# Patient Record
Sex: Female | Born: 2000
Health system: Southern US, Community
[De-identification: ages and names within clinical notes are randomized; demographics above are authoritative.]

## PROBLEM LIST (undated history)

## (undated) DIAGNOSIS — R112 Nausea with vomiting, unspecified: Secondary | ICD-10-CM

## (undated) DIAGNOSIS — F329 Major depressive disorder, single episode, unspecified: Secondary | ICD-10-CM

## (undated) DIAGNOSIS — F419 Anxiety disorder, unspecified: Secondary | ICD-10-CM

## (undated) DIAGNOSIS — D573 Sickle-cell trait: Secondary | ICD-10-CM

## (undated) DIAGNOSIS — F1290 Cannabis use, unspecified, uncomplicated: Secondary | ICD-10-CM

## (undated) DIAGNOSIS — F32A Depression, unspecified: Secondary | ICD-10-CM

---
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
## 2022-04-13 IMAGING — US US OB COMP LESS 14 WK
1 series · 15 of 28 positions shown · non-contrast
Comparison: None.

CLINICAL DATA: Pregnant, unspecified abdominal pain, unknown LMP.

EXAM:
OBSTETRIC <14 WK ULTRASOUND
TECHNIQUE: Transabdominal ultrasound was performed for evaluation of the
gestation as well as the maternal uterus and adnexal regions.

[Series 1: us ob comp less 14 wks mc & wl · 15 of 38 slices shown]
[im 1/38]
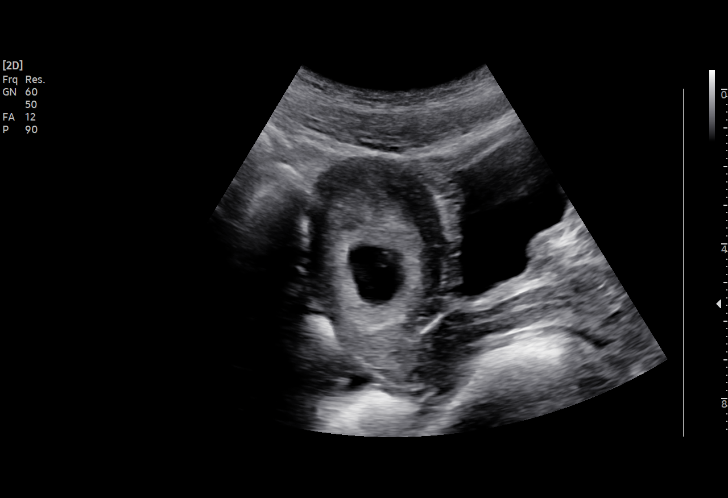
[im 3/38]
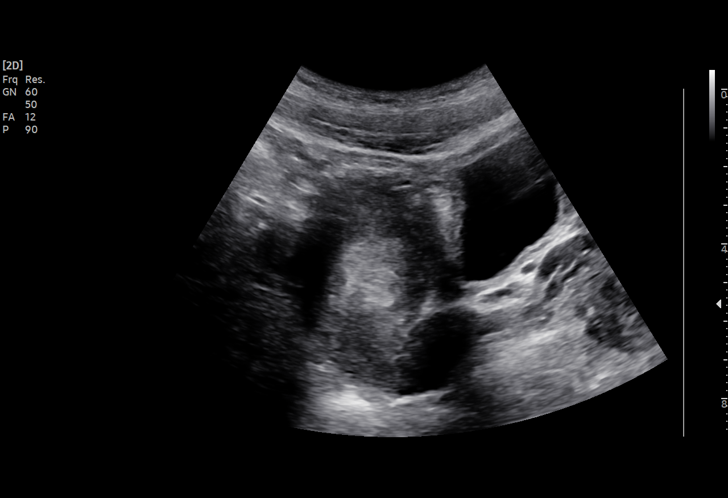
[im 6/38]
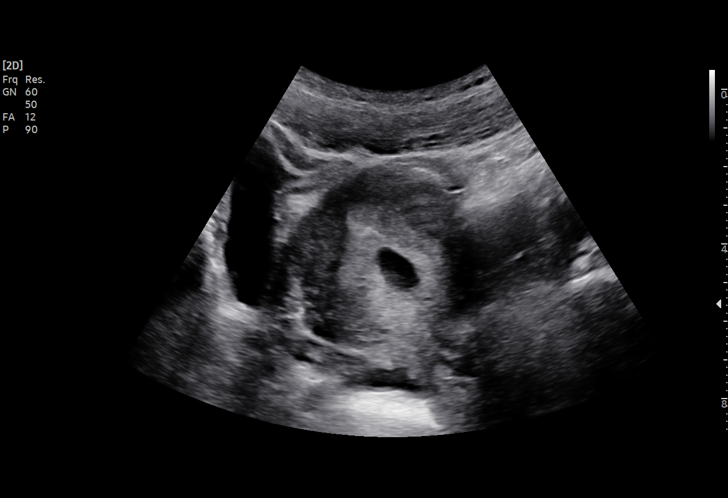
[im 9/38]
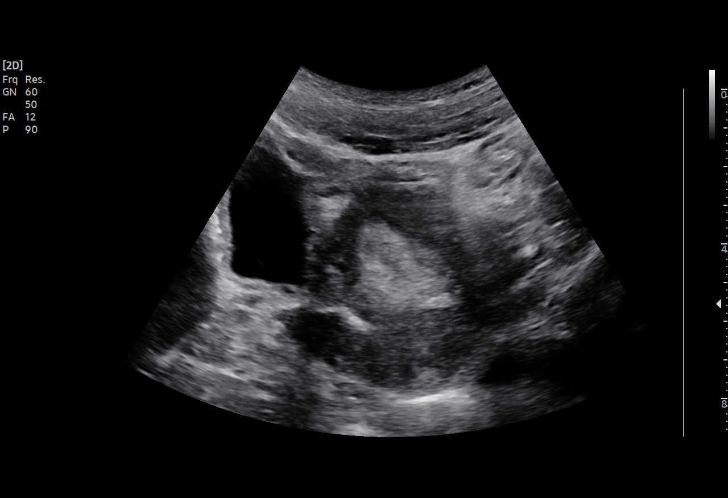
[im 11/38]
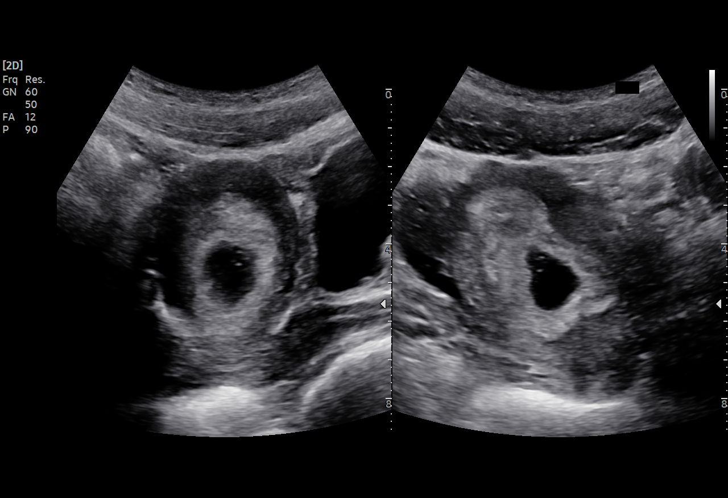
[im 14/38]
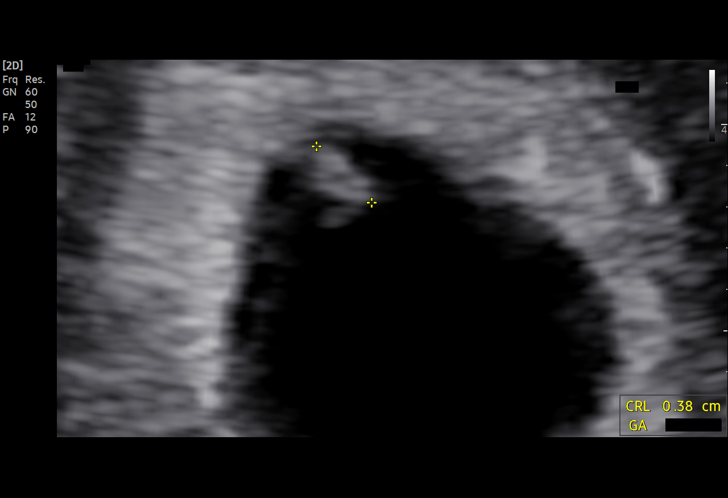
[im 17/38]
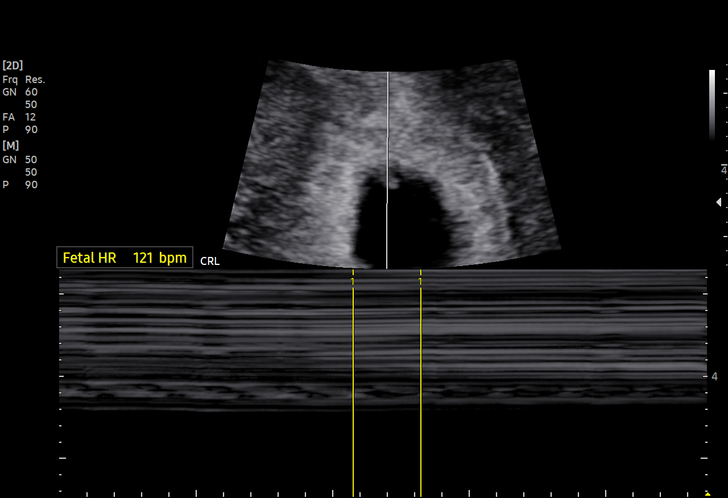
[im 20/38]
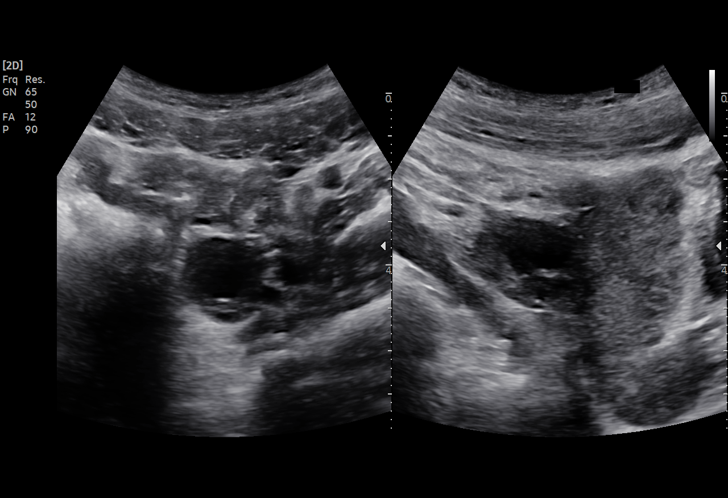
[im 21/38]
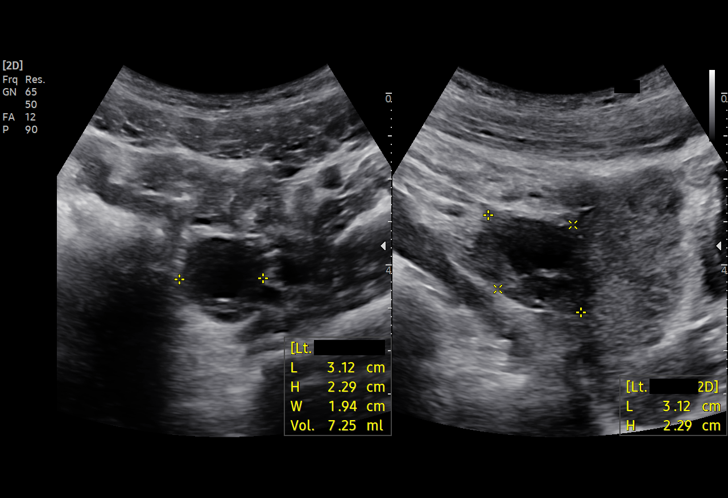
[im 24/38]
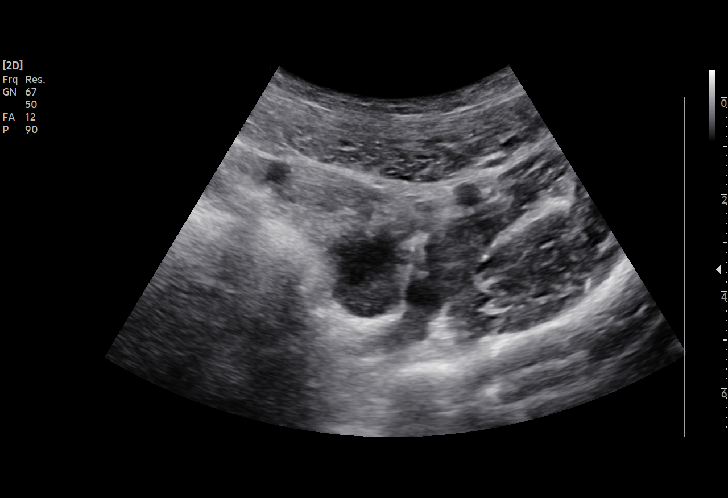
[im 27/38]
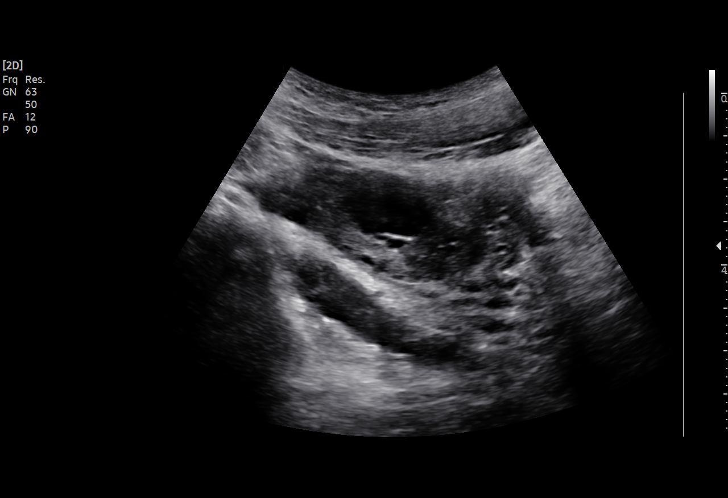
[im 29/38]
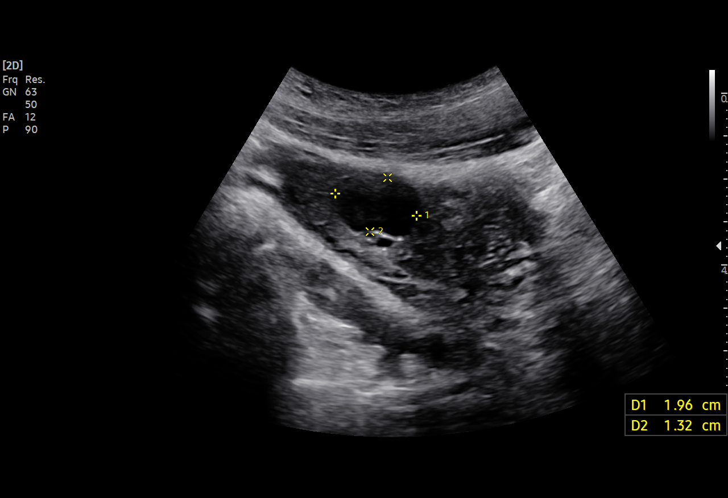
[im 32/38]
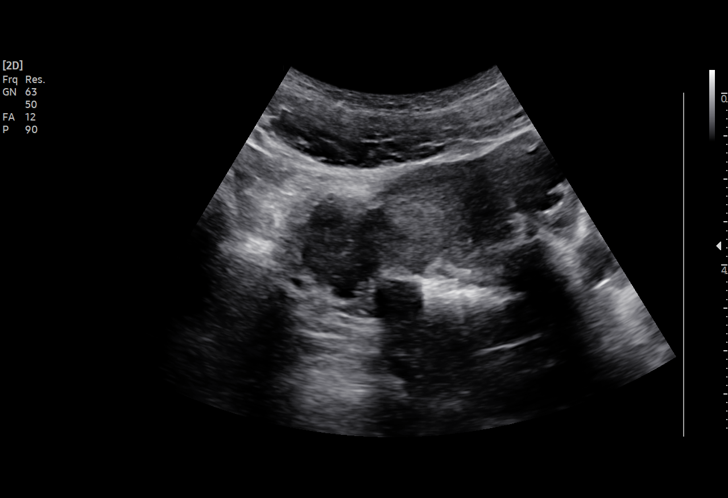
[im 35/38]
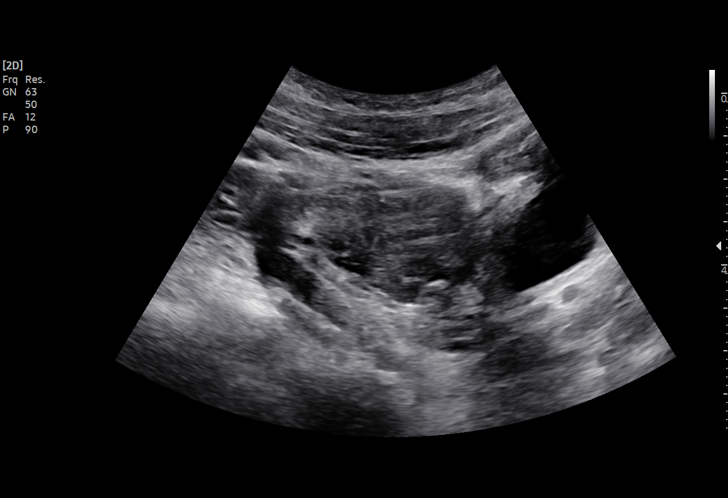
[im 38/38]
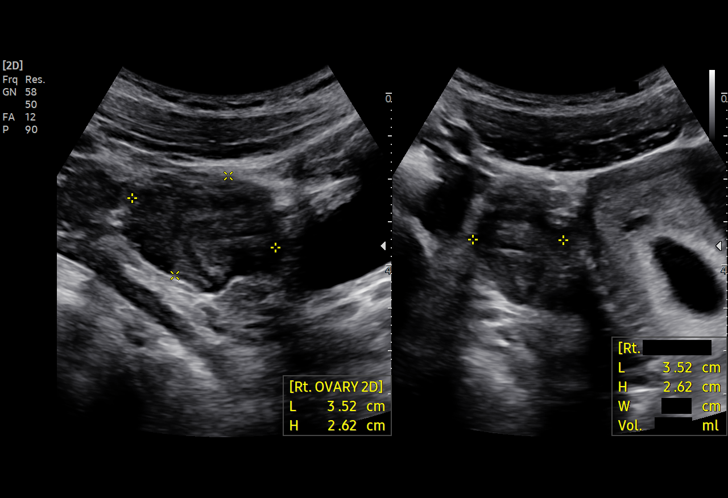

[15 of 28 positions shown; findings below may reference images not displayed]

FINDINGS: Intrauterine gestational sac: Present, single

Yolk sac:  Present, single, normal appearing

Embryo:  Present, single

Cardiac Activity: Present, regular

Heart Rate: 121 bpm

MSD: Appropriate given fetal size

CRL:   4 mm   6 w 0 d                  US EDC: 09/17/2021

Subchorionic hemorrhage: A small subchorionic hemorrhage is present
comprising less than 25% of the chorionic surface.

Maternal uterus/adnexae: The uterus is anteverted. No intrauterine
masses are seen. The cervix is closed and is unremarkable. No free
fluid within the pelvis. The maternal ovaries are unremarkable.
IMPRESSION: Single living intrauterine gestation with an estimated gestational
age of 6 weeks, 0 days.

Small subchorionic hemorrhage.

## 2022-04-14 IMAGING — US US ABDOMEN LIMITED
1 series · 15 of 25 positions shown · non-contrast
Comparison: None.

CLINICAL DATA: Pregnant, right upper quadrant abdominal pain

EXAM:
ULTRASOUND ABDOMEN LIMITED RIGHT UPPER QUADRANT

[Series 1: us abdomen limited · 51 acquisitions, 15 frames shown]
[im 1/51]
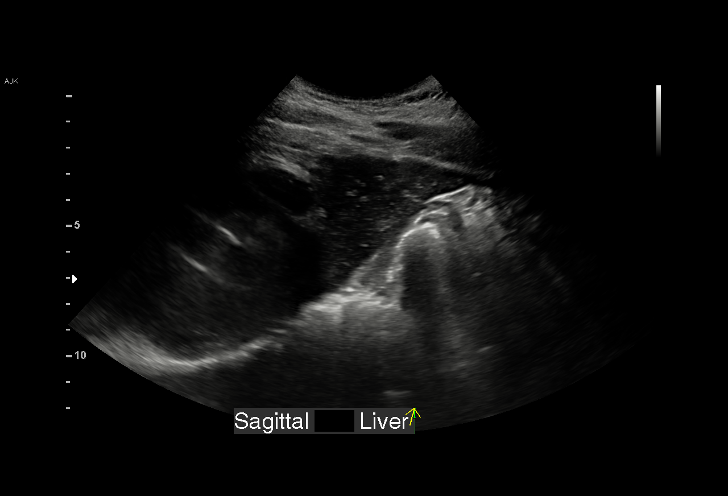
[im 5/51]
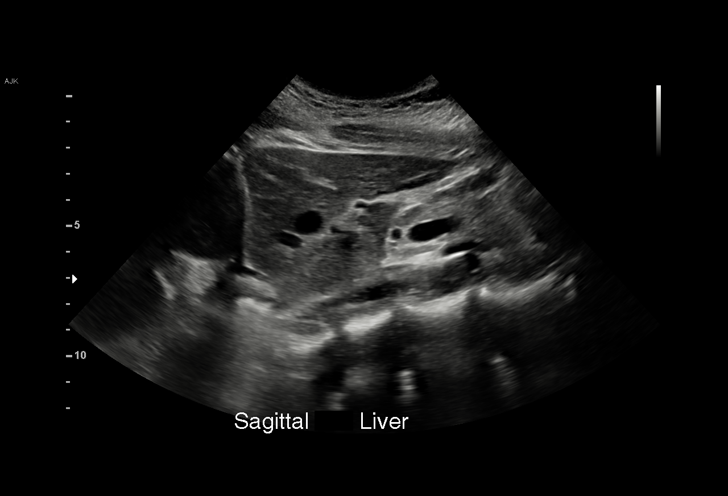
[im 9/51]
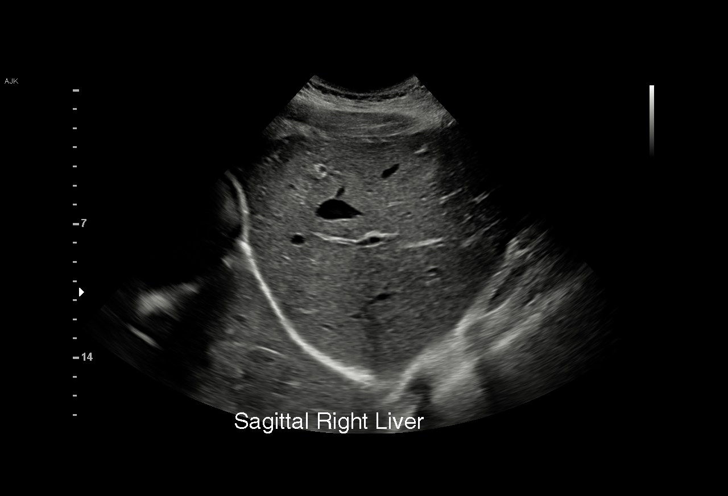
[im 11/51]
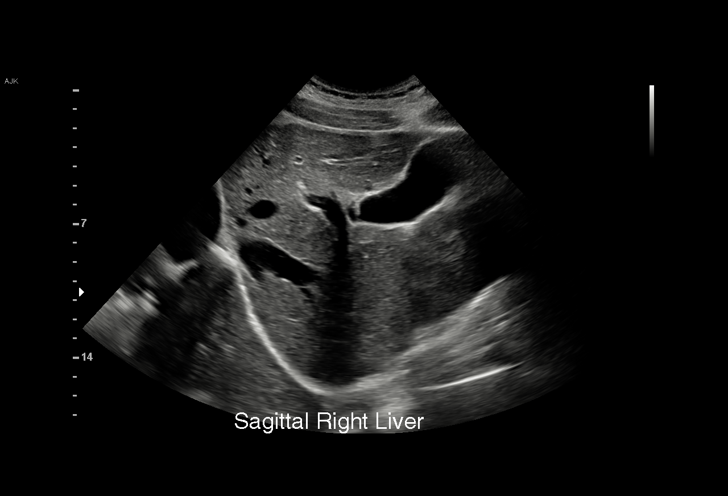
[im 15/51]
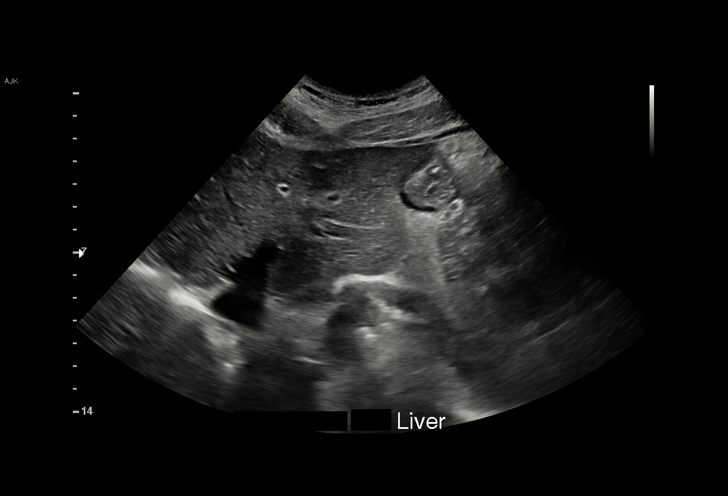
[im 19/51]
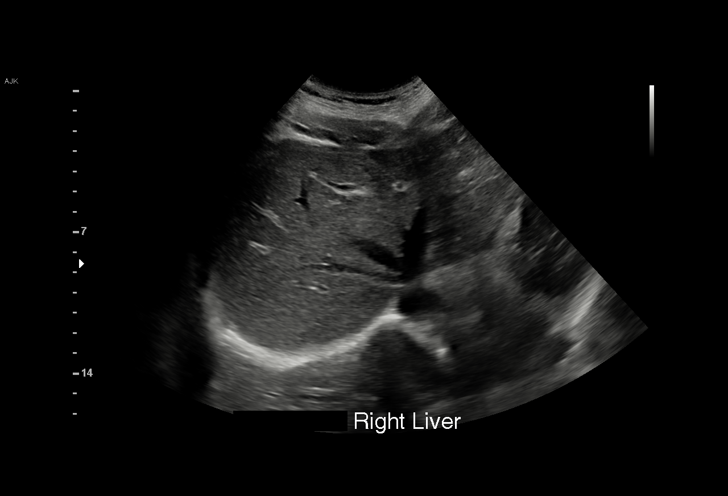
[im 21/51]
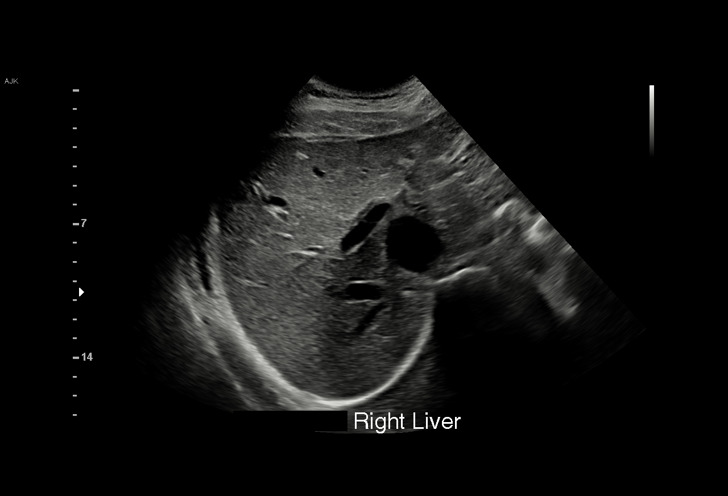
[im 26/51]
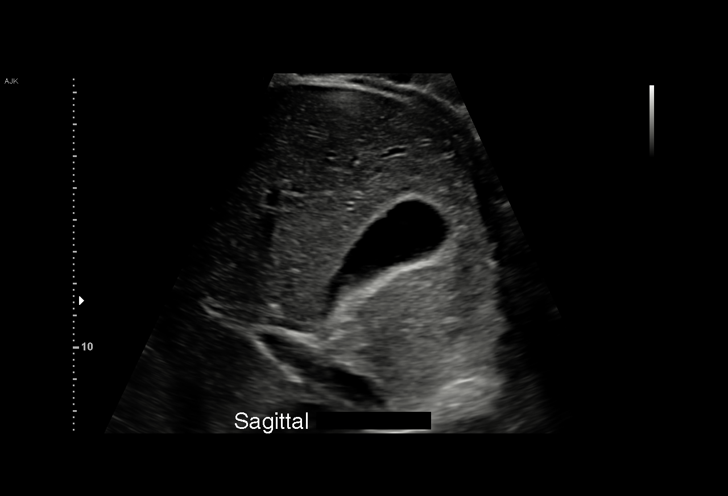
[im 30/51]
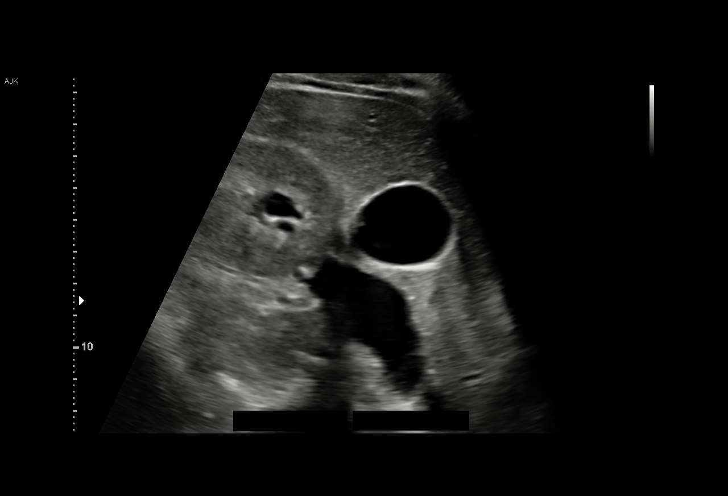
[im 32/51]
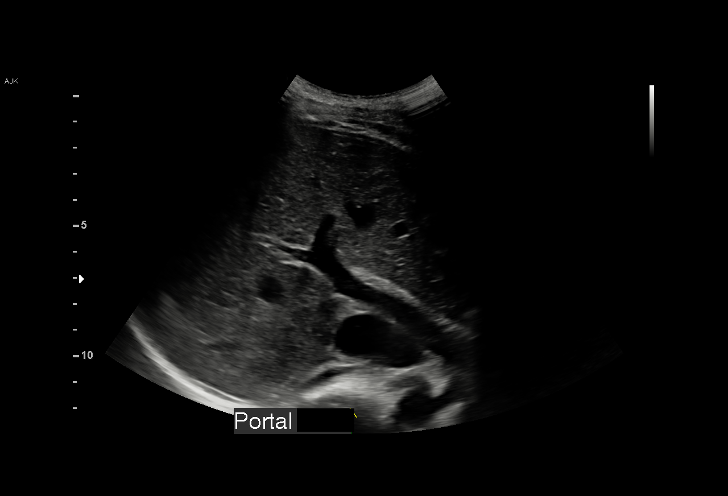
[im 36/51]
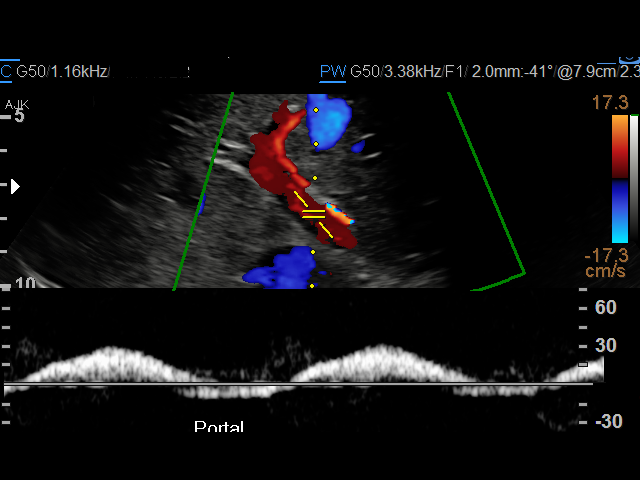
[im 40/51]
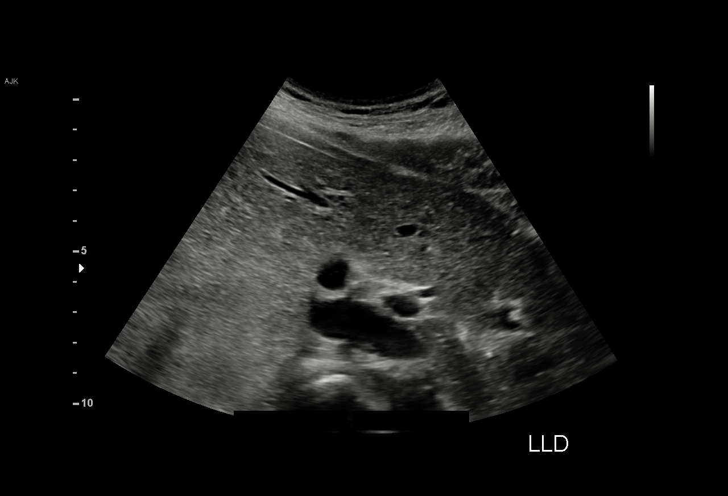
[im 42/51]
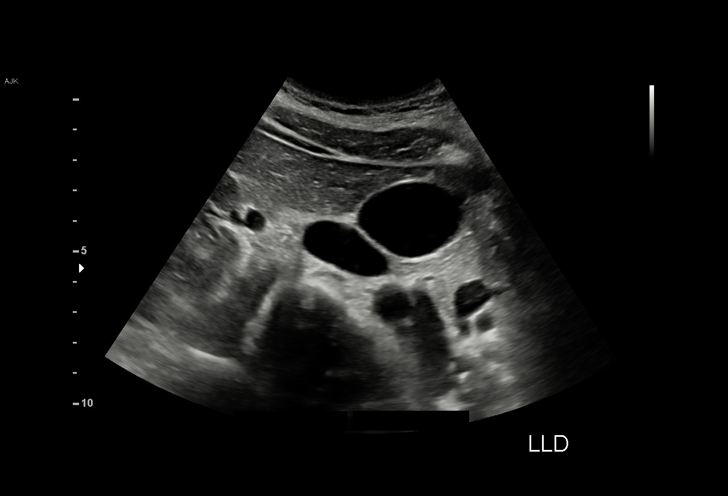
[im 46/51]
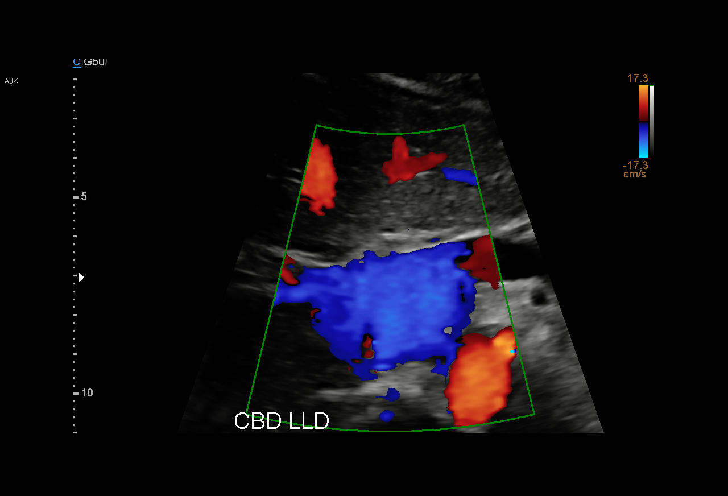
[im 51/51]
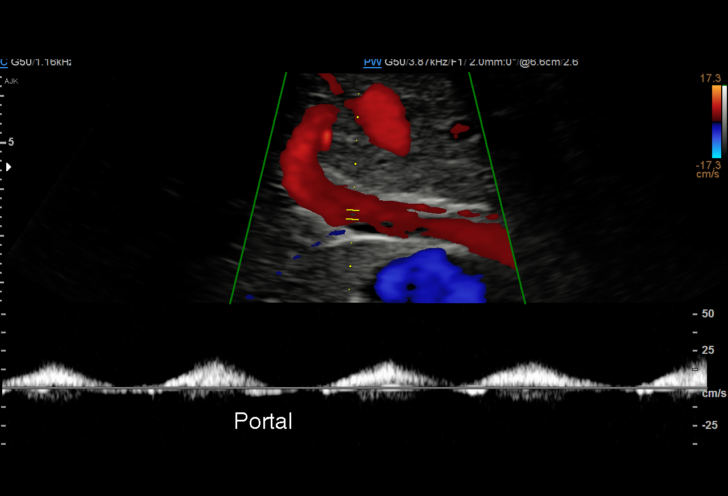

[15 of 25 positions shown; findings below may reference images not displayed]

FINDINGS: Gallbladder:

No gallstones or wall thickening visualized. No sonographic Murphy
sign noted by sonographer.

Common bile duct:

Diameter: 2 mm in proximal diameter

Liver:

Hepatic parenchymal echogenicity and echotexture is normal. No focal
intrahepatic masses are seen and there is no intrahepatic biliary
ductal dilation. The intrahepatic inferior vena cava and hepatic
veins at the hepatic caval junction are all widely patent. The
portal vein is patent but demonstrates biphasic flow. This can be
seen in the setting of right heart failure or tricuspid
regurgitation, elevated sinusoidal pressure, as can be seen with
underlying hepatocellular dysfunction, or post sinusoidal
obstruction such as hepatic Ferienhaus disease. Nock Chiari,
however, is unlikely given patency of the a central hepatic venous
vasculature.

Other: None.
IMPRESSION: Biphasic portal venous waveform. Differential considerations are as
listed above and correlation with liver enzymes may be helpful. Most
commonly, this is seen in the setting of tricuspid regurgitation in
a patient of this age and could be confirmed with echocardiography
if indicated.

## 2022-06-14 ENCOUNTER — Encounter (HOSPITAL_COMMUNITY): Payer: Self-pay

## 2022-06-14 ENCOUNTER — Ambulatory Visit (HOSPITAL_COMMUNITY)
Admission: EM | Admit: 2022-06-14 | Discharge: 2022-06-14 | Disposition: A | Discharge status: Home / Self Care | Payer: Medicaid Other | Attending: Physician Assistant | Admitting: Physician Assistant

## 2022-06-14 DIAGNOSIS — Z20822 Contact with and (suspected) exposure to covid-19: Secondary | ICD-10-CM | POA: Diagnosis not present

## 2022-06-14 DIAGNOSIS — Z87891 Personal history of nicotine dependence: Secondary | ICD-10-CM | POA: Insufficient documentation

## 2022-06-14 DIAGNOSIS — J029 Acute pharyngitis, unspecified: Secondary | ICD-10-CM | POA: Diagnosis present

## 2022-06-14 DIAGNOSIS — J069 Acute upper respiratory infection, unspecified: Secondary | ICD-10-CM | POA: Insufficient documentation

## 2022-06-14 LAB — RESP PANEL BY RT-PCR (FLU A&B, COVID) ARPGX2
Influenza A by PCR: NEGATIVE
Influenza B by PCR: NEGATIVE
SARS Coronavirus 2 by RT PCR: NEGATIVE

## 2022-06-14 MED ORDER — FLUTICASONE PROPIONATE 50 MCG/ACT NA SUSP: 1.0000 | Freq: Every day | NASAL | 0 refills | Status: DC

## 2022-06-14 MED ORDER — PROMETHAZINE-DM 6.25-15 MG/5ML PO SYRP: 5.0000 mL | ORAL_SOLUTION | Freq: Two times a day (BID) | ORAL | 0 refills | Status: DC | PRN

## 2022-06-14 NOTE — ED Triage Notes (Signed)
Pt present to the office today for cough,congestion and sore throat x 2-3 days.

## 2022-06-14 NOTE — Discharge Instructions (Signed)
Monitor your MyChart for results.  We will contact you if you are positive for COVID or flu.  Use Flonase for nasal congestion.  Take Promethazine DM for cough.  This will make you sleepy so do not drive or drink alcohol while taking it.  You can use over-the-counter medications for symptom management.  Rest and drink plenty of fluid.  If your symptoms do not improving by next week return for reevaluation.  If anything worsens to be seen immediately.

## 2022-06-14 NOTE — ED Provider Notes (Signed)
Cathy Graham    CSN: [id number] Arrival date & time: 06/14/22  1205      History   Chief Complaint Chief Complaint  Patient presents with   Sore Throat    HPI Cathy Graham is a 21 y.o. female.   Patient presents today with a 2 to 3-day history of URI symptoms.  Reports nasal congestion, mild cough, chills, fatigue, malaise.  Denies any chest pain, shortness of breath, nausea, vomiting, diarrhea.  Does report known sick contacts but does not know what they were ultimately diagnosed with.  She has not had COVID in the past.  Has had COVID vaccines but no booster.  Denies history of allergies, asthma, COPD.  Does smoke.  No concern for pregnancy.  Has been taking over-the-counter medications with minimal improvement of symptoms.  Denies any recent antibiotic or steroid use.    Past Medical History:  Diagnosis Date   Anxiety    Cannabinoid hyperemesis syndrome    Depression    Sickle cell trait [facility])     Patient Active Problem List   Diagnosis Date Noted   Moderate dehydration 04/13/2020   Emesis, persistent 04/12/2020   Emesis 04/12/2020   MDD (major depressive disorder), recurrent severe, without psychosis ([place]) 08/09/2019    Past Surgical History:  Procedure Laterality Date   GUM SURGERY  2020    OB History     Gravida  1   Para      Term      Preterm      AB      Living         SAB      IAB      Ectopic      Multiple      Live Births               Home Medications    Prior to Admission medications   Medication Sig Start Date End Date Taking? Authorizing Provider  fluticasone (FLONASE) 50 MCG/ACT nasal spray Place 1 spray into both nostrils daily. 06/14/22  Yes Taiwan Millon K, PA-C  promethazine-dextromethorphan (PROMETHAZINE-DM) 6.25-15 MG/5ML syrup Take 5 mLs by mouth 2 (two) times daily as needed for cough. 06/14/22  Yes Anthonee Gelin K, PA-C  capsaicin (ZOSTRIX) 0.025 % cream Apply topically 3 (three) times daily as  needed (For nausea/pain associated with cannabinoid hyperemesis). 04/13/20   Esperanza Richters, MD  Doxylamine-Pyridoxine 10-10 MG TBEC Two tablets at bedtime on day 1 and 2; if symptoms persist, take 1 tablet in morning and 2 tablets at bedtime on day 3; if symptoms persist, may increase to 1 tablet in morning, 1 tablet mid-afternoon, and 2 tablets at bedtime on day 4 01/22/21   Petrucelli, Samantha R, PA-C  famotidine (PEPCID) 40 MG tablet Take 1 tablet (40 mg total) by mouth daily. 04/13/20   Esperanza Richters, MD  hydrOXYzine (ATARAX/VISTARIL) 10 MG tablet Take 10 mg by mouth every 6 (six) hours as needed for anxiety. 08/26/19   [provider]  promethazine (PHENERGAN) 25 MG tablet Take 1 tablet (25 mg total) by mouth every 6 (six) hours as needed for nausea or vomiting. 01/23/21   Jorje Guild, NP  scopolamine (TRANSDERM-SCOP) 1 MG/3DAYS Place 1 patch (1.5 mg total) onto the skin every 3 (three) days. 01/26/21   Gabriel Carina, CNM    Family History Family History  Family history unknown: Yes    Social History Social History   Tobacco Use   Smoking status: Former  Types: Cigarettes    Quit date: 12/23/2020    Years since quitting: 1.4   Smokeless tobacco: Never  Vaping Use   Vaping Use: Former  Substance Use Topics   Alcohol use: Not Currently    Comment: Patient reports using alcohol some of the time when it is available.   Drug use: Not Currently    Types: Marijuana    Comment: last reported use 01/14/2021     Allergies   Patient has no known allergies.   Review of Systems Review of Systems  Constitutional:  Positive for activity change, chills and fatigue. Negative for appetite change and fever.  HENT:  Positive for congestion and sore throat. Negative for sinus pressure and sneezing.   Respiratory:  Positive for cough. Negative for shortness of breath.   Cardiovascular:  Negative for chest pain.  Gastrointestinal:  Negative for abdominal pain, diarrhea,  nausea and vomiting.  Neurological:  Negative for dizziness, light-headedness and headaches.     Physical Exam Triage Vital Signs ED Triage Vitals  Enc Vitals Group     BP 06/14/22 1417 113/65     Pulse Rate 06/14/22 1417 100     Resp 06/14/22 1422 18     Temp 06/14/22 1417 98.3 F (36.8 C)     Temp Source 06/14/22 1417 Oral     SpO2 06/14/22 1422 98 %     Weight --      Height --      Head Circumference --      Peak Flow --      Pain Score --      Pain Loc --      Pain Edu? --      Excl. in [place]? --    No data found.  Updated Vital Signs BP 113/65   Pulse 100   Temp 98.3 F (36.8 C) (Oral)   Resp 18   SpO2 98%   Visual Acuity Right Eye Distance:   Left Eye Distance:   Bilateral Distance:    Right Eye Near:   Left Eye Near:    Bilateral Near:     Physical Exam Vitals reviewed.  Constitutional:      General: She is awake. She is not in acute distress.    Appearance: Normal appearance. She is well-developed. She is not ill-appearing.     Comments: Very pleasant female appears stated age in no acute distress sitting comfortably in exam room  HENT:     Head: Normocephalic and atraumatic.     Right Ear: Tympanic membrane, ear canal and external ear normal. Tympanic membrane is not erythematous or bulging.     Left Ear: Tympanic membrane, ear canal and external ear normal. Tympanic membrane is not erythematous or bulging.     Nose:     Right Sinus: No maxillary sinus tenderness or frontal sinus tenderness.     Left Sinus: No maxillary sinus tenderness or frontal sinus tenderness.     Mouth/Throat:     Pharynx: Uvula midline. Posterior oropharyngeal erythema present. No oropharyngeal exudate.  Cardiovascular:     Rate and Rhythm: Normal rate and regular rhythm.     Heart sounds: Normal heart sounds, S1 normal and S2 normal. No murmur heard. Pulmonary:     Effort: Pulmonary effort is normal.     Breath sounds: Normal breath sounds. No wheezing, rhonchi or rales.      Comments: Clear to auscultation bilaterally Psychiatric:        Behavior: Behavior is cooperative.  UC Treatments / Results  Labs (all labs ordered are listed, but only abnormal results are displayed) Labs Reviewed  RESP PANEL BY RT-PCR (FLU A&B, COVID) [id number]    EKG   Radiology No results found.  Procedures Procedures (including critical care time)  Medications Ordered in UC Medications - No data to display  Initial Impression / Assessment and Plan / UC Course  I have reviewed the triage vital signs and the nursing notes.  Pertinent labs & imaging results that were available during my care of the patient were reviewed by me and considered in my medical decision making (see chart for details).     Patient is well-appearing, afebrile, nontoxic, nontachycardic.  Suspect viral etiology given clinical presentation.  COVID/flu testing was obtained today-results pending.  She is outside the window of effectiveness for Tamiflu and is young and otherwise healthy send not interested in COVID antivirals.  We will treat symptomatically with Flonase and Promethazine DM.  Discussed that this medication can be sedating and she is not to drive or drink alcohol with taking it.  Can use over-the-counter medications for additional symptom relief.  She is to rest and drink plenty of fluid.  Recommend she monitor her MyChart for results.  We will contact her if she is positive.  She was provided a work excuse note with current CDC return to work guidelines based on COVID test result.  Discussed that if she has any worsening symptoms she needs to be seen immediately.  Strict return precautions given.  Final Clinical Impressions(s) / UC Diagnoses   Final diagnoses:  Upper respiratory tract infection, unspecified type     Discharge Instructions      Monitor your MyChart for results.  We will contact you if you are positive for COVID or flu.  Use Flonase for nasal congestion.  Take  Promethazine DM for cough.  This will make you sleepy so do not drive or drink alcohol while taking it.  You can use over-the-counter medications for symptom management.  Rest and drink plenty of fluid.  If your symptoms do not improving by next week return for reevaluation.  If anything worsens to be seen immediately.     ED Prescriptions     Medication Sig Dispense Auth. Provider   fluticasone (FLONASE) 50 MCG/ACT nasal spray Place 1 spray into both nostrils daily. 16 g Cylie Dor K, PA-C   promethazine-dextromethorphan (PROMETHAZINE-DM) 6.25-15 MG/5ML syrup Take 5 mLs by mouth 2 (two) times daily as needed for cough. 118 mL Atziri Zubiate K, PA-C      PDMP not reviewed this encounter.   Terrilee Croak, PA-C 06/14/22 1445

## 2022-07-17 ENCOUNTER — Ambulatory Visit (INDEPENDENT_AMBULATORY_CARE_PROVIDER_SITE_OTHER): Payer: Medicaid Other

## 2022-07-17 ENCOUNTER — Encounter (HOSPITAL_COMMUNITY): Payer: Self-pay

## 2022-07-17 ENCOUNTER — Ambulatory Visit (HOSPITAL_COMMUNITY)
Admission: EM | Admit: 2022-07-17 | Discharge: 2022-07-17 | Disposition: A | Discharge status: Home / Self Care | Payer: Medicaid Other | Attending: Family Medicine | Admitting: Family Medicine

## 2022-07-17 DIAGNOSIS — M25512 Pain in left shoulder: Secondary | ICD-10-CM | POA: Diagnosis not present

## 2022-07-17 MED ORDER — DEXAMETHASONE SODIUM PHOSPHATE 10 MG/ML IJ SOLN
INTRAMUSCULAR | Status: AC
  Filled 2022-07-17: qty 1

## 2022-07-17 MED ORDER — DEXAMETHASONE SODIUM PHOSPHATE 10 MG/ML IJ SOLN: 10.0000 mg | Freq: Once | INTRAMUSCULAR | Status: DC

## 2022-07-17 MED ORDER — IBUPROFEN 800 MG PO TABS: 800.0000 mg | ORAL_TABLET | Freq: Three times a day (TID) | ORAL | 0 refills | Status: DC

## 2022-07-17 MED ORDER — DEXAMETHASONE SODIUM PHOSPHATE 10 MG/ML IJ SOLN
10.0000 mg | Freq: Once | INTRAMUSCULAR | Status: AC
  Administered 2022-07-17: 10 mg via INTRAMUSCULAR
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
## 2022-07-17 NOTE — Discharge Instructions (Addendum)
Meds ordered this encounter  Medications   dexamethasone (DECADRON) injection 10 mg    
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
## 2023-05-19 ENCOUNTER — Emergency Department (HOSPITAL_COMMUNITY)
Admission: EM | Admit: 2023-05-19 | Discharge: 2023-05-19 | Disposition: A | Discharge status: Home / Self Care | Payer: MEDICAID | Attending: Emergency Medicine | Admitting: Emergency Medicine

## 2023-05-19 ENCOUNTER — Encounter (HOSPITAL_COMMUNITY): Payer: Self-pay

## 2023-05-19 ENCOUNTER — Other Ambulatory Visit: Payer: Self-pay

## 2023-05-19 DIAGNOSIS — R1110 Vomiting, unspecified: Secondary | ICD-10-CM

## 2023-05-19 DIAGNOSIS — R112 Nausea with vomiting, unspecified: Secondary | ICD-10-CM | POA: Diagnosis present

## 2023-05-19 DIAGNOSIS — Y900 Blood alcohol level of less than 20 mg/100 ml: Secondary | ICD-10-CM | POA: Insufficient documentation

## 2023-05-19 DIAGNOSIS — R109 Unspecified abdominal pain: Secondary | ICD-10-CM | POA: Diagnosis not present

## 2023-05-19 LAB — CBC WITH DIFFERENTIAL/PLATELET
Abs Immature Granulocytes: 0.06 10*3/uL (ref 0.00–0.07)
Basophils Absolute: 0.1 10*3/uL (ref 0.0–0.1)
Basophils Relative: 0 %
Eosinophils Absolute: 0 10*3/uL (ref 0.0–0.5)
Eosinophils Relative: 0 %
HCT: 38.2 % (ref 36.0–46.0)
Hemoglobin: 13.4 g/dL (ref 12.0–15.0)
Immature Granulocytes: 1 %
Lymphocytes Relative: 5 %
Lymphs Abs: 0.6 10*3/uL — ABNORMAL LOW (ref 0.7–4.0)
MCH: 30.4 pg (ref 26.0–34.0)
MCHC: 35.1 g/dL (ref 30.0–36.0)
MCV: 86.6 fL (ref 80.0–100.0)
Monocytes Absolute: 0.3 10*3/uL (ref 0.1–1.0)
Monocytes Relative: 2 %
Neutro Abs: 11 10*3/uL — ABNORMAL HIGH (ref 1.7–7.7)
Neutrophils Relative %: 92 %
Platelets: 348 10*3/uL (ref 150–400)
RBC: 4.41 MIL/uL (ref 3.87–5.11)
RDW: 12.2 % (ref 11.5–15.5)
WBC: 12 10*3/uL — ABNORMAL HIGH (ref 4.0–10.5)
nRBC: 0 % (ref 0.0–0.2)

## 2023-05-19 LAB — COMPREHENSIVE METABOLIC PANEL
ALT: 16 U/L (ref 0–44)
AST: 23 U/L (ref 15–41)
Albumin: 4.6 g/dL (ref 3.5–5.0)
Alkaline Phosphatase: 51 U/L (ref 38–126)
Anion gap: 15 (ref 5–15)
BUN: 9 mg/dL (ref 6–20)
CO2: 19 mmol/L — ABNORMAL LOW (ref 22–32)
Calcium: 9.3 mg/dL (ref 8.9–10.3)
Chloride: 108 mmol/L (ref 98–111)
Creatinine, Ser: 0.8 mg/dL (ref 0.44–1.00)
GFR, Estimated: >60 mL/min (ref >60)
Glucose, Bld: 128 mg/dL — ABNORMAL HIGH (ref 70–99)
Potassium: 3.5 mmol/L (ref 3.5–5.1)
Sodium: 142 mmol/L (ref 135–145)
Total Bilirubin: 0.9 mg/dL (ref 0.3–1.2)
Total Protein: 8.5 g/dL — ABNORMAL HIGH (ref 6.5–8.1)

## 2023-05-19 LAB — ETHANOL: Alcohol, Ethyl (B): <10 mg/dL (ref <10)

## 2023-05-19 MED ORDER — ONDANSETRON HCL 4 MG/2ML IJ SOLN
4.0000 mg | Freq: Once | INTRAMUSCULAR | Status: DC
  Filled 2023-05-19: qty 2

## 2023-05-19 MED ORDER — DROPERIDOL 2.5 MG/ML IJ SOLN
2.5000 mg | Freq: Once | INTRAMUSCULAR | Status: AC
  Administered 2023-05-19: 2.5 mg via INTRAVENOUS
  Filled 2023-05-19: qty 2

## 2023-05-19 MED ORDER — SODIUM CHLORIDE 0.9 % IV BOLUS
1000.0000 mL | Freq: Once | INTRAVENOUS | Status: AC
  Administered 2023-05-19: 1000 mL via INTRAVENOUS

## 2023-05-19 NOTE — ED Provider Notes (Signed)
[place] EMERGENCY DEPARTMENT AT [facility] Provider Note   CSN: [id number] Arrival date & time: 05/19/23  0847     History  Chief Complaint  Patient presents with   Abdominal Pain   Nausea   Emesis    Cathy Graham is a 22 y.o. female.  This is a 22 year old female here today for nausea and vomiting.  She says that she was out drinking alcohol last night.  She also has a history of cannabinoid hyperemesis syndrome.  She continues to use this.  She is currently on her period.   Abdominal Pain Associated symptoms: vomiting   Emesis Associated symptoms: abdominal pain        Home Medications Prior to Admission medications   Not on File      Allergies    Patient has no known allergies.    Review of Systems   Review of Systems  Gastrointestinal:  Positive for abdominal pain and vomiting.    Physical Exam Updated Vital Signs BP (!) 105/52   Pulse (!) 52   Temp 97.8 F (36.6 C) (Oral)   Resp 15   SpO2 100%  Physical Exam Vitals reviewed.  Constitutional:      General: She is in acute distress.     Appearance: She is not toxic-appearing.  HENT:     Head: Normocephalic.  Abdominal:     General: Abdomen is flat.     Palpations: Abdomen is soft.     Tenderness: There is no abdominal tenderness.     ED Results / Procedures / Treatments   Labs (all labs ordered are listed, but only abnormal results are displayed) Labs Reviewed  CBC WITH DIFFERENTIAL/PLATELET - Abnormal; Notable for the following components:      Result Value   WBC 12.0 (*)    Neutro Abs 11.0 (*)    Lymphs Abs 0.6 (*)    All other components within normal limits  COMPREHENSIVE METABOLIC PANEL - Abnormal; Notable for the following components:   CO2 19 (*)    Glucose, Bld 128 (*)    Total Protein 8.5 (*)    All other components within normal limits  ETHANOL  PREGNANCY, URINE  URINALYSIS, ROUTINE W REFLEX MICROSCOPIC    EKG None  Radiology No results  found.  Procedures Procedures    Medications Ordered in ED Medications  sodium chloride 0.9 % bolus 1,000 mL (0 mLs Intravenous Stopped 05/19/23 1004)  droperidol (INAPSINE) 2.5 MG/ML injection 2.5 mg (2.5 mg Intravenous Given 05/19/23 0913)    ED Course/ Medical Decision Making/ A&P                                 Medical Decision Making 22 year old female here today with vomiting.  Differential diagnoses include alcoholic gastritis, cannabinoid hyperemesis syndrome, veisalgia.  Plan-will provide the patient with some IV fluids, droperidol.  Basic labs ordered.  Abdomen soft.  With her history, and the patient drinking alcohol last night, I have less concern for an acute intra-abdominal process.  Will reassess following symptomatic management.  Reassessment-10:30 AM.  Patient has pulled out her IV and would like to go home.  Urine has not resulted.  Would not change management.  I reviewed the patient's labs, mild leukocytosis, likely reactive.  Will discharge home.  Amount and/or Complexity of Data Reviewed Labs: ordered.  Risk Prescription drug management.  Final Clinical Impression(s) / ED Diagnoses Final diagnoses:  Vomiting, unspecified vomiting type, unspecified whether nausea present    Rx / DC Orders ED Discharge Orders     None         Anders Simmonds T, DO 05/19/23 1033

## 2023-05-19 NOTE — ED Triage Notes (Signed)
Pt BIB [facility] from home d/t abd pain along w n/v. Cannot pinpoint the pain location. Pt reported smoking weed and drinking last night, and had episode like this before after drinking and smoking. A&O X4.

## 2023-05-19 NOTE — Discharge Instructions (Signed)
I would stop smoking marijuana, cut back on your alcohol.  You can continue to drink fluids at home.  Follow-up with your primary care doctor.

## 2023-06-01 ENCOUNTER — Other Ambulatory Visit: Payer: Self-pay

## 2023-06-01 ENCOUNTER — Emergency Department (HOSPITAL_COMMUNITY)
Admission: EM | Admit: 2023-06-01 | Discharge: 2023-06-02 | Disposition: A | Discharge status: Home / Self Care | Payer: MEDICAID | Attending: Emergency Medicine | Admitting: Emergency Medicine

## 2023-06-01 DIAGNOSIS — Z6371 Stress on family due to return of family member from military deployment: Secondary | ICD-10-CM | POA: Insufficient documentation

## 2023-06-01 DIAGNOSIS — F411 Generalized anxiety disorder: Secondary | ICD-10-CM | POA: Insufficient documentation

## 2023-06-01 DIAGNOSIS — Z87891 Personal history of nicotine dependence: Secondary | ICD-10-CM | POA: Diagnosis not present

## 2023-06-01 DIAGNOSIS — F332 Major depressive disorder, recurrent severe without psychotic features: Secondary | ICD-10-CM | POA: Insufficient documentation

## 2023-06-01 DIAGNOSIS — Z638 Other specified problems related to primary support group: Secondary | ICD-10-CM

## 2023-06-01 DIAGNOSIS — R4689 Other symptoms and signs involving appearance and behavior: Secondary | ICD-10-CM

## 2023-06-01 LAB — COMPREHENSIVE METABOLIC PANEL
ALT: 17 U/L (ref 0–44)
AST: 20 U/L (ref 15–41)
Albumin: 4.3 g/dL (ref 3.5–5.0)
Alkaline Phosphatase: 44 U/L (ref 38–126)
Anion gap: 12 (ref 5–15)
BUN: 5 mg/dL — ABNORMAL LOW (ref 6–20)
CO2: 23 mmol/L (ref 22–32)
Calcium: 9.5 mg/dL (ref 8.9–10.3)
Chloride: 105 mmol/L (ref 98–111)
Creatinine, Ser: 0.69 mg/dL (ref 0.44–1.00)
GFR, Estimated: >60 mL/min (ref >60)
Glucose, Bld: 91 mg/dL (ref 70–99)
Potassium: 3.5 mmol/L (ref 3.5–5.1)
Sodium: 140 mmol/L (ref 135–145)
Total Bilirubin: 0.6 mg/dL (ref 0.3–1.2)
Total Protein: 7.8 g/dL (ref 6.5–8.1)

## 2023-06-01 LAB — CBC WITH DIFFERENTIAL/PLATELET
Abs Immature Granulocytes: 0.02 10*3/uL (ref 0.00–0.07)
Basophils Absolute: 0 10*3/uL (ref 0.0–0.1)
Basophils Relative: 1 %
Eosinophils Absolute: 0 10*3/uL (ref 0.0–0.5)
Eosinophils Relative: 0 %
HCT: 37.2 % (ref 36.0–46.0)
Hemoglobin: 12.7 g/dL (ref 12.0–15.0)
Immature Granulocytes: 0 %
Lymphocytes Relative: 25 %
Lymphs Abs: 1.4 10*3/uL (ref 0.7–4.0)
MCH: 29.8 pg (ref 26.0–34.0)
MCHC: 34.1 g/dL (ref 30.0–36.0)
MCV: 87.3 fL (ref 80.0–100.0)
Monocytes Absolute: 0.4 10*3/uL (ref 0.1–1.0)
Monocytes Relative: 7 %
Neutro Abs: 3.9 10*3/uL (ref 1.7–7.7)
Neutrophils Relative %: 67 %
Platelets: 304 10*3/uL (ref 150–400)
RBC: 4.26 MIL/uL (ref 3.87–5.11)
RDW: 12.8 % (ref 11.5–15.5)
WBC: 5.8 10*3/uL (ref 4.0–10.5)
nRBC: 0 % (ref 0.0–0.2)

## 2023-06-01 LAB — HCG, SERUM, QUALITATIVE: Preg, Serum: NEGATIVE

## 2023-06-01 LAB — SALICYLATE LEVEL: Salicylate Lvl: <7 mg/dL — ABNORMAL LOW (ref 7.0–30.0)

## 2023-06-01 LAB — ETHANOL: Alcohol, Ethyl (B): <10 mg/dL (ref <10)

## 2023-06-01 LAB — ACETAMINOPHEN LEVEL: Acetaminophen (Tylenol), Serum: <10 ug/mL — ABNORMAL LOW (ref 10–30)

## 2023-06-01 MED ORDER — ZIPRASIDONE MESYLATE 20 MG IM SOLR: 20.0000 mg | Freq: Once | INTRAMUSCULAR | Status: DC

## 2023-06-01 MED ORDER — ZIPRASIDONE MESYLATE 20 MG IM SOLR: 10.0000 mg | Freq: Once | INTRAMUSCULAR | Status: DC

## 2023-06-01 NOTE — ED Notes (Signed)
Pt participating in TTS at this time.
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
## 2023-06-02 DIAGNOSIS — F411 Generalized anxiety disorder: Secondary | ICD-10-CM | POA: Diagnosis not present

## 2023-06-02 DIAGNOSIS — Z638 Other specified problems related to primary support group: Secondary | ICD-10-CM

## 2023-06-02 LAB — URINALYSIS, ROUTINE W REFLEX MICROSCOPIC
Bilirubin Urine: NEGATIVE
Glucose, UA: NEGATIVE mg/dL
Ketones, ur: NEGATIVE mg/dL
Nitrite: POSITIVE — AB
Protein, ur: NEGATIVE mg/dL
Specific Gravity, Urine: 1.025 (ref 1.005–1.030)
pH: 6 (ref 5.0–8.0)

## 2023-06-02 LAB — URINALYSIS, MICROSCOPIC (REFLEX)

## 2023-06-02 LAB — RAPID URINE DRUG SCREEN, HOSP PERFORMED
Amphetamines: NOT DETECTED
Barbiturates: NOT DETECTED
Benzodiazepines: NOT DETECTED
Cocaine: NOT DETECTED
Opiates: NOT DETECTED
Tetrahydrocannabinol: POSITIVE — AB

## 2023-06-02 MED ORDER — HYDROXYZINE HCL 25 MG PO TABS
25.0000 mg | ORAL_TABLET | Freq: Three times a day (TID) | ORAL | 0 refills | Status: AC | PRN
Start: 2023-06-02 — End: 2023-06-16

## 2023-06-02 MED ORDER — IBUPROFEN 400 MG PO TABS
400.0000 mg | ORAL_TABLET | Freq: Once | ORAL | Status: AC | PRN
  Administered 2023-06-02: 400 mg via ORAL
  Filled 2023-06-02: qty 1

## 2023-06-02 NOTE — Discharge Instructions (Signed)
Behavioral Health Resources:  Intensive Outpatient Programs: [facility]      [address] [place], [place] [phone] Both a day and evening program       [facility]     [address]        [place], [place] [address] [phone]         ADS: [facility] [address] [place] [phone]  [facility] Mental Health ACCESS LINE: [phone] or [phone] [address] [place], [place] [address] [email or web address]   Substance Abuse Resources: Alcohol and Drug Services  [phone] [facility] [phone] [facility] [phone] Floydene Flock [phone] Residential & Outpatient Substance Abuse Program  [phone]  Psychological Services: [facility]  [phone] [facility]  [phone] [facility], [phone] [place]. [address], [place], ACCESS LINE: [phone] or [phone], [email or web address]  Mobile Crisis Teams:                                        Therapeutic Alternatives         Mobile Crisis Care Unit [phone]             [facility] [address] Ginette Otto [phone]                                         Interventionist [address] [place] [address] [place] [phone]  Self-Help/Support Groups: Mental Health Assoc. of [employer] of support groups [phone] (call for more info)  Narcotics Anonymous (NA) Caring [facility] [address] [place] - 2 meetings at this location  Residential Treatment Programs:  [facility]      [address]        [place]       [phone]         [facility] [address] [place] [address] [place], [place]  [address] [phone]  [facility]  [address] [place] [address] [place], [place]  [address] [phone] Admissions: 8am-3pm M-F  [facility]     [address]        [place], [place] [address]       [phone]         [facility] [address] [place], [place] [phone]  [facility] [address] [place], [place] [phone]  [facility] - Intensive Outpatient      [address]     [place], [place]       [phone]         [facility] ([facility]     [address] [place], [place] [phone] or [phone]  Residential Treatment Services (RTS), Medicaid [address] [place], [place] [phone]  [facility] [address]                                               [address] [place] [phone]  [facility] Resources: Estate manager/land agent [phone]               General Therapy  Angie Fava, PhD        [address] [place], [place] [address]         [phone]   Insurance  [facility]   [address] [place], [place] [address] [phone]  [facility] [address] [place], [place] [address] [phone] Insurance/Medicaid/sponsorship through [facility]                                              [address]                                        [place], [place] [address]    Therapy/tele-psych/case         [phone]          [facility] [address], [place]  [address]  Adolescent/group home/case management [phone]                                           Creola Corn PhD       General therapy       Insurance   [phone]         Dr. Lolly Mustache, Insurance, M-F [phone]  [facility]  [employer] [facility] Dept. [address]                 [address] [place] [address]         [address] [place] [address]  Blondell Reveal Phone:  [phone]                                  Phone:  [phone]                   Phone:  [phone]  [facility], [phone] [facility] [phone]       -     [facility] in [place], [address]             [phone], Insurance  COUNSELING AGENCIES in [place] (Accepting [facility])  [facility] [address] [place].  [phone]  [facility] [address] [place].    [phone]  Individual and [facility] Therapists [address] [place].    [phone]   Habla Espaol/Interprete  [facility] [place] [address]  [phone]  [facility] [address]  "[place]"    [phone]   [facility] [address] [place].     [phone] [facility] (SEL) [address] [place].  [phone]  Psychiatric services/servicios psiquiatricos  [facility] [address]   [phone] [facility] [address]     [phone]  [facility] [address]      [phone]   Habla Espaol/Interprete & Psychiatric services/servicios psiquiatricos  [facility] [address]  [phone]  [facility] [address] [place].     [phone]  [facility] Care Services [address]    [phone] Psychotherapeutic Services [address] (20yo & over only)     [phone]    [phone]  Provides information on mental health, intellectual/developmental disabilities & substance abuse services in [facility]

## 2023-06-02 NOTE — ED Notes (Signed)
Pt refused lunch tray 

## 2023-06-02 NOTE — ED Provider Notes (Signed)
Per behavioral health patient appropriate for discharge with reversal of IVC.  Allegedly some statements made for involuntary commitment were false.  Patient in no distress, pleasant.   Gerhard Munch, MD 06/02/23 1147

## 2023-06-02 NOTE — ED Notes (Signed)
Pt refused VS

## 2023-06-02 NOTE — ED Notes (Signed)
Notified secretary of need for breakfast tray to be ordered.

## 2023-06-02 NOTE — ED Notes (Signed)
Pt wanded by security.

## 2023-06-02 NOTE — Progress Notes (Signed)
Pt has been psych cleared per [facility]. CSW will remove from the TTS/ BH shift report. TOC to assist with any discharge needs.   Maryjean Ka, MSW, LCSWA 06/02/2023 12:14 PM
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
## 2023-06-02 NOTE — ED Notes (Signed)
Pt c/o back pain. EDP notified.  ?

## 2023-06-02 NOTE — ED Notes (Signed)
Pt irritable while waiting for disposition

## 2023-06-02 NOTE — ED Notes (Signed)
NAD noted at this time, pt resting in bed, respirations even and unlabored, skin warm and dry, bed in low position. Will continue to monitor. Safety sitter at bedside.

## 2023-06-02 NOTE — ED Notes (Addendum)
Reviewed discharge instructions with patient. Follow-up care and medications reviewed. Patient verbalized understanding. Patient A&Ox4, and ambulatory with steady gait upon discharge. All belongings returned to pt.   Cab voucher provided to take pt back home.

## 2023-08-06 ENCOUNTER — Emergency Department (HOSPITAL_COMMUNITY)
Admission: EM | Admit: 2023-08-06 | Discharge: 2023-08-06 | Discharge status: Against Medical Advice | Payer: MEDICAID | Attending: Emergency Medicine | Admitting: Emergency Medicine

## 2023-08-06 ENCOUNTER — Encounter (HOSPITAL_COMMUNITY): Payer: Self-pay

## 2023-08-06 ENCOUNTER — Other Ambulatory Visit: Payer: Self-pay

## 2023-08-06 DIAGNOSIS — Z5321 Procedure and treatment not carried out due to patient leaving prior to being seen by health care provider: Secondary | ICD-10-CM | POA: Insufficient documentation

## 2023-08-06 DIAGNOSIS — R109 Unspecified abdominal pain: Secondary | ICD-10-CM | POA: Insufficient documentation

## 2023-08-06 DIAGNOSIS — R112 Nausea with vomiting, unspecified: Secondary | ICD-10-CM | POA: Insufficient documentation

## 2023-08-06 DIAGNOSIS — G8929 Other chronic pain: Secondary | ICD-10-CM | POA: Diagnosis not present

## 2023-08-06 LAB — CBC WITH DIFFERENTIAL/PLATELET
Abs Immature Granulocytes: 0.04 10*3/uL (ref 0.00–0.07)
Basophils Absolute: 0 10*3/uL (ref 0.0–0.1)
Basophils Relative: 0 %
Eosinophils Absolute: 0 10*3/uL (ref 0.0–0.5)
Eosinophils Relative: 0 %
HCT: 36.3 % (ref 36.0–46.0)
Hemoglobin: 12.7 g/dL (ref 12.0–15.0)
Immature Granulocytes: 0 %
Lymphocytes Relative: 6 %
Lymphs Abs: 0.7 10*3/uL (ref 0.7–4.0)
MCH: 31.2 pg (ref 26.0–34.0)
MCHC: 35 g/dL (ref 30.0–36.0)
MCV: 89.2 fL (ref 80.0–100.0)
Monocytes Absolute: 0.1 10*3/uL (ref 0.1–1.0)
Monocytes Relative: 1 %
Neutro Abs: 11.5 10*3/uL — ABNORMAL HIGH (ref 1.7–7.7)
Neutrophils Relative %: 93 %
Platelets: 316 10*3/uL (ref 150–400)
RBC: 4.07 MIL/uL (ref 3.87–5.11)
RDW: 13 % (ref 11.5–15.5)
WBC: 12.4 10*3/uL — ABNORMAL HIGH (ref 4.0–10.5)
nRBC: 0 % (ref 0.0–0.2)

## 2023-08-06 LAB — COMPREHENSIVE METABOLIC PANEL
ALT: 27 U/L (ref 0–44)
AST: 25 U/L (ref 15–41)
Albumin: 4.5 g/dL (ref 3.5–5.0)
Alkaline Phosphatase: 52 U/L (ref 38–126)
Anion gap: 14 (ref 5–15)
BUN: 11 mg/dL (ref 6–20)
CO2: 17 mmol/L — ABNORMAL LOW (ref 22–32)
Calcium: 9.4 mg/dL (ref 8.9–10.3)
Chloride: 107 mmol/L (ref 98–111)
Creatinine, Ser: 0.62 mg/dL (ref 0.44–1.00)
GFR, Estimated: >60 mL/min (ref >60)
Glucose, Bld: 121 mg/dL — ABNORMAL HIGH (ref 70–99)
Potassium: 3.6 mmol/L (ref 3.5–5.1)
Sodium: 138 mmol/L (ref 135–145)
Total Bilirubin: 0.8 mg/dL (ref <1.2)
Total Protein: 8.1 g/dL (ref 6.5–8.1)

## 2023-08-06 LAB — ETHANOL: Alcohol, Ethyl (B): <10 mg/dL (ref <10)

## 2023-08-06 MED ORDER — DROPERIDOL 2.5 MG/ML IJ SOLN
2.5000 mg | Freq: Once | INTRAMUSCULAR | Status: AC
  Administered 2023-08-06: 2.5 mg via INTRAVENOUS
  Filled 2023-08-06: qty 2

## 2023-08-06 NOTE — ED Triage Notes (Signed)
Patient complains of nausea and increased anxiety x 1 day. Anxious on arrival. Hasn't taken daily meds. Also complains of general abd pain

## 2023-08-06 NOTE — ED Notes (Signed)
Patient and family left AMA due to family emergency

## 2023-08-06 NOTE — ED Provider Triage Note (Signed)
Emergency Medicine Provider Triage Evaluation Note  Cathy Graham , a 22 y.o. female  was evaluated in triage.  Pt complains of nausea, vomiting abdominal pain that occurred at 1 AM this morning.  Patient with a history of cannabinoid hyperemesis syndrome, chronic abdominal pain..  Review of Systems   Physical Exam  BP (!) 109/45   Pulse 61   Temp 97.9 F (36.6 C) (Oral)   Resp (!) 24   SpO2 100%  Gen:   Awake, no distress   Resp:  Normal effort  MSK:   Moves extremities without difficulty  Other:  Soft abdomen  Medical Decision Making  Medically screening exam initiated at 8:49 AM.  Appropriate orders placed.  Cathy Graham was informed that the remainder of the evaluation will be completed by another provider, this initial triage assessment does not replace that evaluation, and the importance of remaining in the ED until their evaluation is complete.  Blood work ordered.  Based on patient's history, droperidol ordered.   Anders Simmonds T, DO 08/06/23 [phone]

## 2023-08-26 ENCOUNTER — Encounter (HOSPITAL_COMMUNITY): Payer: Self-pay | Admitting: *Deleted

## 2023-08-26 ENCOUNTER — Other Ambulatory Visit: Payer: Self-pay

## 2023-08-26 ENCOUNTER — Ambulatory Visit (HOSPITAL_COMMUNITY)
Admission: EM | Admit: 2023-08-26 | Discharge: 2023-08-26 | Disposition: A | Discharge status: Home / Self Care | Payer: MEDICAID | Attending: Internal Medicine | Admitting: Internal Medicine

## 2023-08-26 DIAGNOSIS — J069 Acute upper respiratory infection, unspecified: Secondary | ICD-10-CM | POA: Diagnosis present

## 2023-08-26 LAB — POCT INFLUENZA A/B
Influenza A, POC: NEGATIVE
Influenza B, POC: NEGATIVE

## 2023-08-26 LAB — POCT RAPID STREP A (OFFICE): Rapid Strep A Screen: NEGATIVE

## 2023-08-26 MED ORDER — ACETAMINOPHEN 325 MG PO TABS
ORAL_TABLET | ORAL | Status: AC
  Filled 2023-08-26: qty 2

## 2023-08-26 MED ORDER — ACETAMINOPHEN 325 MG PO TABS
650.0000 mg | ORAL_TABLET | Freq: Once | ORAL | Status: AC
  Administered 2023-08-26: 650 mg via ORAL

## 2023-08-26 NOTE — Discharge Instructions (Signed)
 Please maintain adequate hydration You may take over-the-counter Delsym as needed for cough. You may take Tylenol  or ibuprofen  as needed for pain and/or fever If you have worsening symptoms please return to urgent care to be reevaluated We have sent swab for COVID testing Your strep and flu tests were negative.

## 2023-08-26 NOTE — ED Triage Notes (Signed)
 PT reports for the past 2 days she has had a sore throat ,neck pain and HA.

## 2023-08-27 LAB — SARS CORONAVIRUS 2 (TAT 6-24 HRS): SARS Coronavirus 2: NEGATIVE

## 2023-08-28 ENCOUNTER — Ambulatory Visit: Payer: MEDICAID

## 2023-08-28 LAB — CULTURE, GROUP A STREP (THRC)

## 2023-08-29 NOTE — ED Provider Notes (Signed)
 [facility]    CSN: [id number] Arrival date & time: 08/26/23  1007      History   Chief Complaint Chief Complaint  Patient presents with   Sore Throat    HPI CHYANNA FLOCK is a 23 y.o. female comes to the urgent care with a 2-day history of sore throat, neck pain and headache.  Patient's symptoms started fairly abruptly and has been persistent.  She denies any nausea or vomiting.  No generalized bodyaches.  She denies any sick contacts.  No shortness of breath or wheezing.  No cough or sputum production.  No chest tightness.  Patient endorses pain on swallowing.  HPI  Past Medical History:  Diagnosis Date   Anxiety    Cannabinoid hyperemesis syndrome    Depression    Sickle cell trait [facility])     Patient Active Problem List   Diagnosis Date Noted   Family conflict 06/02/2023   GAD (generalized anxiety disorder) 06/02/2023   Moderate dehydration 04/13/2020   Emesis, persistent 04/12/2020   Emesis 04/12/2020   MDD (major depressive disorder), recurrent severe, without psychosis (HCC) 08/09/2019    Past Surgical History:  Procedure Laterality Date   GUM SURGERY  2020    OB History     Gravida  1   Para      Term      Preterm      AB      Living         SAB      IAB      Ectopic      Multiple      Live Births               Home Medications    Prior to Admission medications   Not on File    Family History Family History  Family history unknown: Yes    Social History Social History   Tobacco Use   Smoking status: Former    Current packs/day: 0.00    Types: Cigarettes    Quit date: 12/23/2020    Years since quitting: 2.6   Smokeless tobacco: Never  Vaping Use   Vaping status: Former  Substance Use Topics   Alcohol use: Not Currently    Comment: Patient reports using alcohol some of the time when it is available.   Drug use: Not Currently    Types: Marijuana    Comment: last reported use 01/14/2021      Allergies   Patient has no known allergies.   Review of Systems Review of Systems As per HPI  Physical Exam Triage Vital Signs ED Triage Vitals  Encounter Vitals Group     BP 08/26/23 1140 122/76     Systolic BP Percentile --      Diastolic BP Percentile --      Pulse Rate 08/26/23 1140 (!) 101     Resp 08/26/23 1140 20     Temp 08/26/23 1140 (!) 102.4 F (39.1 C)     Temp src --      SpO2 08/26/23 1140 97 %     Weight --      Height --      Head Circumference --      Peak Flow --      Pain Score 08/26/23 1137 10     Pain Loc --      Pain Education --      Exclude from Growth Chart --    No data found.  Updated Vital Signs BP 122/76   Pulse (!) 101   Temp (!) 102.4 F (39.1 C)   Resp 20   LMP 08/12/2023   SpO2 97%   Visual Acuity Right Eye Distance:   Left Eye Distance:   Bilateral Distance:    Right Eye Near:   Left Eye Near:    Bilateral Near:     Physical Exam Vitals and nursing note reviewed.  Constitutional:      Appearance: She is ill-appearing.  HENT:     Right Ear: Tympanic membrane normal.     Left Ear: Tympanic membrane normal.     Mouth/Throat:     Mouth: Mucous membranes are moist.     Pharynx: Posterior oropharyngeal erythema present.     Tonsils: No tonsillar exudate or tonsillar abscesses.  Cardiovascular:     Rate and Rhythm: Normal rate and regular rhythm.     Heart sounds: Normal heart sounds.  Pulmonary:     Effort: Pulmonary effort is normal.     Breath sounds: Normal breath sounds.  Abdominal:     General: Bowel sounds are normal.     Palpations: Abdomen is soft.  Neurological:     Mental Status: She is alert.      UC Treatments / Results  Labs (all labs ordered are listed, but only abnormal results are displayed) Labs Reviewed  SARS CORONAVIRUS 2 (TAT 6-24 HRS)  CULTURE, GROUP A STREP [facility])  POCT RAPID STREP A (OFFICE)  POCT INFLUENZA A/B    EKG   Radiology No results  found.  Procedures Procedures (including critical care time)  Medications Ordered in UC Medications  acetaminophen  (TYLENOL ) tablet 650 mg (650 mg Oral Given 08/26/23 1144)    Initial Impression / Assessment and Plan / UC Course  I have reviewed the triage vital signs and the nursing notes.  Pertinent labs & imaging results that were available during my care of the patient were reviewed by me and considered in my medical decision making (see chart for details).     1.  Viral URI with cough: Increase oral fluid intake recommended Acetaminophen  650 mg x 1 dose given during the visit Point-of-care flu test is negative Point-of-care strep test is negative Throat cultures have been sent COVID 19 PCR test has been sent Patient may take Tylenol  or ibuprofen  as needed for pain and/or fever Return precautions given. Final Clinical Impressions(s) / UC Diagnoses   Final diagnoses:  Viral URI with cough     Discharge Instructions      Please maintain adequate hydration You may take over-the-counter Delsym as needed for cough. You may take Tylenol  or ibuprofen  as needed for pain and/or fever If you have worsening symptoms please return to urgent care to be reevaluated We have sent swab for COVID testing Your strep and flu tests were negative.   ED Prescriptions   None    PDMP not reviewed this encounter.   Blaise Aleene KIDD, MD 08/29/23 1050

## 2023-09-03 ENCOUNTER — Ambulatory Visit: Payer: MEDICAID

## 2023-09-03 ENCOUNTER — Ambulatory Visit (HOSPITAL_BASED_OUTPATIENT_CLINIC_OR_DEPARTMENT_OTHER): Payer: MEDICAID

## 2023-09-05 ENCOUNTER — Ambulatory Visit: Payer: MEDICAID

## 2024-02-15 ENCOUNTER — Emergency Department (HOSPITAL_COMMUNITY)
Admission: EM | Admit: 2024-02-15 | Discharge: 2024-02-15 | Disposition: A | Discharge status: Home / Self Care | Payer: MEDICAID | Attending: Emergency Medicine | Admitting: Emergency Medicine

## 2024-02-15 ENCOUNTER — Other Ambulatory Visit: Payer: Self-pay

## 2024-02-15 ENCOUNTER — Emergency Department (HOSPITAL_COMMUNITY): Payer: MEDICAID

## 2024-02-15 ENCOUNTER — Encounter (HOSPITAL_COMMUNITY): Payer: Self-pay | Admitting: *Deleted

## 2024-02-15 DIAGNOSIS — R112 Nausea with vomiting, unspecified: Secondary | ICD-10-CM | POA: Diagnosis present

## 2024-02-15 DIAGNOSIS — R197 Diarrhea, unspecified: Secondary | ICD-10-CM | POA: Insufficient documentation

## 2024-02-15 DIAGNOSIS — R1084 Generalized abdominal pain: Secondary | ICD-10-CM | POA: Insufficient documentation

## 2024-02-15 LAB — CBC
HCT: 37.7 % (ref 36.0–46.0)
Hemoglobin: 12.9 g/dL (ref 12.0–15.0)
MCH: 30.9 pg (ref 26.0–34.0)
MCHC: 34.2 g/dL (ref 30.0–36.0)
MCV: 90.2 fL (ref 80.0–100.0)
Platelets: 331 10*3/uL (ref 150–400)
RBC: 4.18 MIL/uL (ref 3.87–5.11)
RDW: 13.6 % (ref 11.5–15.5)
WBC: 7.3 10*3/uL (ref 4.0–10.5)
nRBC: 0 % (ref 0.0–0.2)

## 2024-02-15 LAB — URINALYSIS, ROUTINE W REFLEX MICROSCOPIC
Bilirubin Urine: NEGATIVE
Glucose, UA: NEGATIVE mg/dL
Hgb urine dipstick: NEGATIVE
Ketones, ur: 5 mg/dL — AB
Leukocytes,Ua: NEGATIVE
Nitrite: NEGATIVE
Protein, ur: NEGATIVE mg/dL
Specific Gravity, Urine: 1.04 — ABNORMAL HIGH (ref 1.005–1.030)
pH: 7 (ref 5.0–8.0)

## 2024-02-15 LAB — COMPREHENSIVE METABOLIC PANEL WITH GFR
ALT: 16 U/L (ref 0–44)
AST: 23 U/L (ref 15–41)
Albumin: 4.5 g/dL (ref 3.5–5.0)
Alkaline Phosphatase: 43 U/L (ref 38–126)
Anion gap: 14 (ref 5–15)
BUN: 6 mg/dL (ref 6–20)
CO2: 17 mmol/L — ABNORMAL LOW (ref 22–32)
Calcium: 9.2 mg/dL (ref 8.9–10.3)
Chloride: 109 mmol/L (ref 98–111)
Creatinine, Ser: 0.57 mg/dL (ref 0.44–1.00)
GFR, Estimated: >60 mL/min (ref >60)
Glucose, Bld: 120 mg/dL — ABNORMAL HIGH (ref 70–99)
Potassium: 3.6 mmol/L (ref 3.5–5.1)
Sodium: 140 mmol/L (ref 135–145)
Total Bilirubin: 0.7 mg/dL (ref 0.0–1.2)
Total Protein: 8.1 g/dL (ref 6.5–8.1)

## 2024-02-15 LAB — LIPASE, BLOOD: Lipase: 21 U/L (ref 11–51)

## 2024-02-15 LAB — HCG, SERUM, QUALITATIVE: Preg, Serum: NEGATIVE

## 2024-02-15 MED ORDER — SODIUM CHLORIDE 0.9 % IV BOLUS
1000.0000 mL | Freq: Once | INTRAVENOUS | Status: AC
  Administered 2024-02-15: 1000 mL via INTRAVENOUS

## 2024-02-15 MED ORDER — ALUM & MAG HYDROXIDE-SIMETH 200-200-20 MG/5ML PO SUSP
30.0000 mL | Freq: Once | ORAL | Status: AC
  Administered 2024-02-15: 30 mL via ORAL
  Filled 2024-02-15: qty 30

## 2024-02-15 MED ORDER — IOHEXOL 350 MG/ML SOLN
75.0000 mL | Freq: Once | INTRAVENOUS | Status: AC | PRN
  Administered 2024-02-15: 75 mL via INTRAVENOUS

## 2024-02-15 MED ORDER — DROPERIDOL 2.5 MG/ML IJ SOLN
1.2500 mg | Freq: Once | INTRAMUSCULAR | Status: DC
  Filled 2024-02-15: qty 2

## 2024-02-15 MED ORDER — DICYCLOMINE HCL 10 MG PO CAPS
10.0000 mg | ORAL_CAPSULE | Freq: Once | ORAL | Status: AC
Start: 2024-02-15 — End: 2024-02-15
  Administered 2024-02-15: 10 mg via ORAL
  Filled 2024-02-15: qty 1

## 2024-02-15 MED ORDER — ONDANSETRON 4 MG PO TBDP
4.0000 mg | ORAL_TABLET | Freq: Once | ORAL | Status: AC
  Administered 2024-02-15: 4 mg via ORAL
  Filled 2024-02-15: qty 1

## 2024-02-15 MED ORDER — ONDANSETRON 4 MG PO TBDP: 4.0000 mg | ORAL_TABLET | Freq: Three times a day (TID) | ORAL | 0 refills | Status: DC | PRN

## 2024-02-15 MED ORDER — MORPHINE SULFATE (PF) 4 MG/ML IV SOLN
4.0000 mg | Freq: Once | INTRAVENOUS | Status: AC
  Administered 2024-02-15: 4 mg via INTRAVENOUS
  Filled 2024-02-15: qty 1

## 2024-02-15 NOTE — ED Triage Notes (Signed)
 PT here via [facility] from home.  States 4 shots of liquor last night, after which she began experiencing generalized abdominal pain, vomiting and diarrhea.  States hx of same, whenever she drinks etoh. Denies marijuana use.

## 2024-02-15 NOTE — ED Provider Notes (Signed)
 [place] EMERGENCY DEPARTMENT AT [facility] Provider Note   CSN: [id number] Arrival date & time: 02/15/24  1232     Patient presents with: Abdominal Pain   Cathy Graham is a 23 y.o. female presenting to the ED brought in by EMS for acute abdominal pain that began last night.  She had large-volume alcohol consumption last night, endorsing around 4 shots of liquor, began to have sharp upper abdominal pain that radiates to the back.  She is markedly uncomfortable, nauseated, and is most comfortable laying prone.  There is associated vomiting and diarrhea.  Previous medical diagnoses are remarkable for major depression disorder, generalized anxiety disorder, and previous issues with cannabis hyperemesis syndrome.  She denies recent cannabis use.  Upon arrival to triage here in the ED, she was provided with a dose of Zofran  with effect on reduced nausea, still has considerable abdominal pain.    Abdominal Pain Associated symptoms: diarrhea, nausea and vomiting        Prior to Admission medications   Not on File    Allergies: Patient has no known allergies.    Review of Systems  Constitutional:  Positive for appetite change.  Gastrointestinal:  Positive for abdominal pain, diarrhea, nausea and vomiting.  All other systems reviewed and are negative.   Updated Vital Signs BP 100/68 (BP Location: Left Arm)   Pulse 90   Temp 98.8 F (37.1 C) (Oral)   Resp 18   Ht 5' 2 (1.575 m)   Wt 54.6 kg   LMP 02/12/2024   SpO2 100%   BMI 22.02 kg/m   Physical Exam Vitals and nursing note reviewed.  Constitutional:      General: She is not in acute distress.    Appearance: Normal appearance.  HENT:     Head: Normocephalic and atraumatic.     Mouth/Throat:     Mouth: Mucous membranes are moist.     Pharynx: Oropharynx is clear.   Eyes:     General: No scleral icterus.    Extraocular Movements: Extraocular movements intact.     Conjunctiva/sclera: Conjunctivae  normal.     Pupils: Pupils are equal, round, and reactive to light.    Cardiovascular:     Rate and Rhythm: Normal rate and regular rhythm.     Pulses: Normal pulses.     Heart sounds: Normal heart sounds. No murmur heard.    No friction rub. No gallop.  Pulmonary:     Effort: Pulmonary effort is normal.     Breath sounds: Normal breath sounds.  Abdominal:     General: Abdomen is flat. Bowel sounds are normal. There is no distension.     Palpations: Abdomen is soft.     Tenderness: There is generalized abdominal tenderness.   Musculoskeletal:        General: Normal range of motion.     Cervical back: Normal range of motion and neck supple.     Right lower leg: No edema.     Left lower leg: No edema.   Skin:    General: Skin is warm and dry.     Capillary Refill: Capillary refill takes less than 2 seconds.   Neurological:     General: No focal deficit present.     Mental Status: She is alert. Mental status is at baseline.   Psychiatric:        Mood and Affect: Mood normal.     (all labs ordered are listed, but only abnormal results are  displayed) Labs Reviewed  COMPREHENSIVE METABOLIC PANEL WITH GFR - Abnormal; Notable for the following components:      Result Value   CO2 17 (*)    Glucose, Bld 120 (*)    All other components within normal limits  URINALYSIS, ROUTINE W REFLEX MICROSCOPIC - Abnormal; Notable for the following components:   Color, Urine COLORLESS (*)    Specific Gravity, Urine 1.040 (*)    Ketones, ur 5 (*)    All other components within normal limits  LIPASE, BLOOD  CBC  HCG, SERUM, QUALITATIVE    EKG: None  Radiology: No results found.   Procedures   Medications Ordered in the ED  sodium chloride  0.9 % bolus 1,000 mL (0 mLs Intravenous Stopped 02/15/24 1447)  ondansetron  (ZOFRAN -ODT) disintegrating tablet 4 mg (4 mg Oral Given 02/15/24 1252)  morphine (PF) 4 MG/ML injection 4 mg (4 mg Intravenous Given 02/15/24 1359)  alum & mag  hydroxide-simeth (MAALOX/MYLANTA) 200-200-20 MG/5ML suspension 30 mL (30 mLs Oral Given 02/15/24 1507)  dicyclomine (BENTYL) capsule 10 mg (10 mg Oral Given 02/15/24 1507)  iohexol  (OMNIPAQUE ) 350 MG/ML injection 75 mL (75 mLs Intravenous Contrast Given 02/15/24 1532)                                    Medical Decision Making Amount and/or Complexity of Data Reviewed Labs: ordered. Radiology: ordered.  Risk OTC drugs. Prescription drug management.   Medical Decision Making:   Cathy Graham is a 23 y.o. female who presented to the ED today with abdominal pain and back pain along with nausea / vomiting detailed above.     Complete initial physical exam performed, notably the patient  was alert and oriented and very uncomfortable appearing.   For the entirety of the exam she remained in a prone position as she states this helps her pain.  Abdomen is diffusely tender specifically in the epigastrium.  Reviewed and confirmed nursing documentation for past medical history, family history, social history.    Initial Assessment:   With the patient's presentation of abdominal pain along with nausea/vomiting, most likely diagnosis is acute pancreatitis secondary to alcohol intake. Other diagnoses were considered including (but not limited to) hepatobiliary disease. These are considered less likely due to history of present illness and physical exam findings.     Initial Plan:  Obtain hCG to rule out pregnancy as an etiology Screening labs including CBC and Metabolic panel to evaluate for infectious or metabolic etiology of disease.  Urinalysis with reflex culture ordered to evaluate for UTI or relevant urologic/nephrologic pathology.  Objective evaluation as below reviewed   Initial Study Results:   Laboratory  All laboratory results reviewed without evidence of clinically relevant pathology.   Exceptions include: None   Radiology:  CT imaging pending at time of handover.  Reassessment  and Plan:   At time of handoff, administered a GI cocktail and also given pain control with morphine, patient has some mild improvement but still has abdominal pain.  CT scan of the abdomen has been ordered, anticipate ability to discharge if CT scan is normal and patient passes p.o. challenge.   Patient signed off to L.  Euel, PA-C.     Final diagnoses:  None    ED Discharge Orders     None          Myriam Dorn BROCKS, [place] 02/15/24 1614    Elnor Savant  A, DO 02/16/24 9280

## 2024-02-15 NOTE — Discharge Instructions (Signed)
 Thank for letting us  evaluate you today.  Your lab work was unremarkable.  Your CT scan did not show any acute abnormalities.  I have sent Zofran  into your pharmacy.  Please place this under your lip and let it dissolve.  Do not swallow it.  Please make sure to drink plenty of fluids.  If you do not eat that is all right as long as you are maintaining oral hydration to include chicken broth, Gatorade, Pedialyte, water, etc.  Return to emergency department if you experience intractable vomiting causing him to be unable to keep water down, significant worsening of symptoms

## 2024-02-15 NOTE — ED Notes (Signed)
 Patient transported to CT

## 2024-02-15 NOTE — ED Notes (Signed)
 Awaiting patient from lobby.

## 2024-02-15 NOTE — ED Provider Notes (Signed)
 Received patient from signout at previous provider.  See his note.  In short, patient presents to emergency department via EMS for evaluation of upper abdominal pain that radiates into the back with associated vomiting and diarrhea.   ED workup notable for no leukocytosis, anemia.  Lipase WNL.  hCG negative.  UA pending.  CT pending.  Previous EDP provided 1 L NS, 4 mg morphine, 4 mg Zofran , GI cocktail, Bentyl  CT negative for acute abnormality.  Unfortunately, patient ripped out her IV stating I was so stressed and ripped it out..  Therefore, I was unable to give her any IV medication to include droperidol  for nausea, vomiting, abdominal pain.  Currently, she is wishing to be discharged.  She is able to keep fluids down since being in ED for the past 4 hours.  She does not want to try keeping down any crackers.  Will discharge her with Zofran  ODT and strict return precautions.  Discussed ED workup, disposition, return to ED precautions with patient who expresses understanding agrees with plan.  All questions answered to their satisfaction.  They are agreeable to plan.  Discharge instructions provided on paperwork   Minnie Tinnie BRAVO, PA 02/15/24 1708    Patsey Lot, MD 02/15/24 [phone]

## 2024-02-15 NOTE — ED Provider Triage Note (Signed)
 Emergency Medicine Provider Triage Evaluation Note  Cathy Graham , a 23 y.o. female  was evaluated in triage.  Pt complains of .  Review of Systems  Positive: As above Negative: As above  Physical Exam  BP 100/68 (BP Location: Left Arm)   Pulse 90   Temp 98.8 F (37.1 C) (Oral)   Resp 18   Ht 5' 2 (1.575 m)   Wt 54.6 kg   LMP 02/12/2024   SpO2 100%   BMI 22.02 kg/m  Gen:   Awake,  uncomfortable nauseous Resp:  Normal effort  MSK:   Moves extremities without difficulty  Other:    Medical Decision Making  Medically screening exam initiated at 1:07 PM.  Appropriate orders placed.  Cathy Graham was informed that the remainder of the evaluation will be completed by another provider, this initial triage assessment does not replace that evaluation, and the importance of remaining in the ED until their evaluation is complete.     Pamella Ozell LABOR, DO 02/15/24 1308

## 2024-02-16 ENCOUNTER — Encounter (HOSPITAL_COMMUNITY): Payer: Self-pay

## 2024-02-16 ENCOUNTER — Other Ambulatory Visit: Payer: Self-pay

## 2024-02-16 ENCOUNTER — Emergency Department (HOSPITAL_COMMUNITY)
Admission: EM | Admit: 2024-02-16 | Discharge: 2024-02-16 | Disposition: A | Discharge status: Home / Self Care | Payer: MEDICAID | Attending: Emergency Medicine | Admitting: Emergency Medicine

## 2024-02-16 DIAGNOSIS — Z5329 Procedure and treatment not carried out because of patient's decision for other reasons: Secondary | ICD-10-CM | POA: Diagnosis not present

## 2024-02-16 DIAGNOSIS — K2970 Gastritis, unspecified, without bleeding: Secondary | ICD-10-CM | POA: Diagnosis not present

## 2024-02-16 DIAGNOSIS — N3000 Acute cystitis without hematuria: Secondary | ICD-10-CM | POA: Insufficient documentation

## 2024-02-16 DIAGNOSIS — R112 Nausea with vomiting, unspecified: Secondary | ICD-10-CM | POA: Diagnosis present

## 2024-02-16 LAB — COMPREHENSIVE METABOLIC PANEL WITH GFR
ALT: 20 U/L (ref 0–44)
AST: 22 U/L (ref 15–41)
Albumin: 4.6 g/dL (ref 3.5–5.0)
Alkaline Phosphatase: 45 U/L (ref 38–126)
Anion gap: 14 (ref 5–15)
BUN: 6 mg/dL (ref 6–20)
CO2: 22 mmol/L (ref 22–32)
Calcium: 9.5 mg/dL (ref 8.9–10.3)
Chloride: 104 mmol/L (ref 98–111)
Creatinine, Ser: 0.73 mg/dL (ref 0.44–1.00)
GFR, Estimated: >60 mL/min (ref >60)
Glucose, Bld: 118 mg/dL — ABNORMAL HIGH (ref 70–99)
Potassium: 3.6 mmol/L (ref 3.5–5.1)
Sodium: 140 mmol/L (ref 135–145)
Total Bilirubin: 0.7 mg/dL (ref 0.0–1.2)
Total Protein: 7.9 g/dL (ref 6.5–8.1)

## 2024-02-16 LAB — URINALYSIS, ROUTINE W REFLEX MICROSCOPIC
Bilirubin Urine: NEGATIVE
Glucose, UA: NEGATIVE mg/dL
Ketones, ur: 20 mg/dL — AB
Nitrite: POSITIVE — AB
Protein, ur: 100 mg/dL — AB
Specific Gravity, Urine: 1.024 (ref 1.005–1.030)
pH: 6 (ref 5.0–8.0)

## 2024-02-16 LAB — CBC WITH DIFFERENTIAL/PLATELET
Abs Immature Granulocytes: 0.02 10*3/uL (ref 0.00–0.07)
Basophils Absolute: 0 10*3/uL (ref 0.0–0.1)
Basophils Relative: 0 %
Eosinophils Absolute: 0 10*3/uL (ref 0.0–0.5)
Eosinophils Relative: 0 %
HCT: 36 % (ref 36.0–46.0)
Hemoglobin: 12.6 g/dL (ref 12.0–15.0)
Immature Granulocytes: 0 %
Lymphocytes Relative: 12 %
Lymphs Abs: 0.9 10*3/uL (ref 0.7–4.0)
MCH: 30.8 pg (ref 26.0–34.0)
MCHC: 35 g/dL (ref 30.0–36.0)
MCV: 88 fL (ref 80.0–100.0)
Monocytes Absolute: 0.3 10*3/uL (ref 0.1–1.0)
Monocytes Relative: 4 %
Neutro Abs: 6.2 10*3/uL (ref 1.7–7.7)
Neutrophils Relative %: 84 %
Platelets: 307 10*3/uL (ref 150–400)
RBC: 4.09 MIL/uL (ref 3.87–5.11)
RDW: 13.4 % (ref 11.5–15.5)
WBC: 7.4 10*3/uL (ref 4.0–10.5)
nRBC: 0 % (ref 0.0–0.2)

## 2024-02-16 LAB — LIPASE, BLOOD: Lipase: 22 U/L (ref 11–51)

## 2024-02-16 MED ORDER — SUCRALFATE 1 GM/10ML PO SUSP
1.0000 g | Freq: Three times a day (TID) | ORAL | 0 refills | Status: AC
Start: 2024-02-16 — End: ?

## 2024-02-16 MED ORDER — SUCRALFATE 1 GM/10ML PO SUSP
1.0000 g | Freq: Once | ORAL | Status: AC
  Administered 2024-02-16: 1 g via ORAL
  Filled 2024-02-16: qty 10

## 2024-02-16 MED ORDER — CEPHALEXIN 250 MG PO CAPS
500.0000 mg | ORAL_CAPSULE | Freq: Once | ORAL | Status: AC
  Administered 2024-02-16: 500 mg via ORAL
  Filled 2024-02-16: qty 2

## 2024-02-16 MED ORDER — HALOPERIDOL 1 MG PO TABS
2.0000 mg | ORAL_TABLET | Freq: Once | ORAL | Status: AC
  Administered 2024-02-16: 2 mg via ORAL
  Filled 2024-02-16 (×2): qty 2

## 2024-02-16 MED ORDER — FAMOTIDINE 20 MG PO TABS
40.0000 mg | ORAL_TABLET | Freq: Once | ORAL | Status: AC
  Administered 2024-02-16: 40 mg via ORAL
  Filled 2024-02-16: qty 2

## 2024-02-16 NOTE — ED Notes (Signed)
 Pt vomited, RN notified

## 2024-02-16 NOTE — ED Provider Notes (Signed)
 [place] EMERGENCY DEPARTMENT AT [facility] Provider Note   CSN: [id number] Arrival date & time: 02/16/24  1505     History Chief Complaint  Patient presents with   Abdominal Pain   Nausea   Emesis    Cathy Graham is a 23 y.o. female w/ PMHx depression, anxiety, alcohol use, THC use who presents to the ED for evaluation of nausea vomiting.  Patient states she drank significant mount of alcohol 2 days ago.  She reports the following day she is experienced nausea vomiting epigastric abdominal pain.  She reports her anxiety has been worse and she is having trouble sleeping.  Patient denies THC or alcohol use since Friday.  Patient was seen yesterday but removed her IV after CT and wanted to leave prior to her CT result.  As lab results were all normal patient was cleared for discharge.    Physical Exam Updated Vital Signs BP 123/80 (BP Location: Left Arm)   Pulse 64   Temp 98.5 F (36.9 C)   Resp 20   LMP 02/12/2024   SpO2 100%  Physical Exam Vitals and nursing note reviewed.  Constitutional:      Appearance: She is well-developed.   Eyes:     Conjunctiva/sclera: Conjunctivae normal.    Cardiovascular:     Rate and Rhythm: Normal rate.  Pulmonary:     Effort: Pulmonary effort is normal.  Abdominal:     Palpations: Abdomen is soft.     Tenderness: There is no abdominal tenderness. There is no guarding or rebound.   Musculoskeletal:        General: No swelling.     Cervical back: Neck supple.   Skin:    General: Skin is warm and dry.     Capillary Refill: Capillary refill takes less than 2 seconds.   Neurological:     Mental Status: She is alert.   Psychiatric:        Mood and Affect: Mood is anxious.        Behavior: Behavior is slowed.     ED Results / Procedures / Treatments   Labs (all labs ordered are listed, but only abnormal results are displayed) Labs Reviewed  COMPREHENSIVE METABOLIC PANEL WITH GFR - Abnormal; Notable for the  following components:      Result Value   Glucose, Bld 118 (*)    All other components within normal limits  URINALYSIS, ROUTINE W REFLEX MICROSCOPIC - Abnormal; Notable for the following components:   Color, Urine AMBER (*)    APPearance CLOUDY (*)    Hgb urine dipstick SMALL (*)    Ketones, ur 20 (*)    Protein, ur 100 (*)    Nitrite POSITIVE (*)    Leukocytes,Ua TRACE (*)    Bacteria, UA FEW (*)    All other components within normal limits  CBC WITH DIFFERENTIAL/PLATELET  LIPASE, BLOOD    EKG None  Radiology CT ABDOMEN PELVIS W CONTRAST Result Date: 02/15/2024 CLINICAL DATA:  Abdominal pain, acute, nonlocalized EXAM: CT ABDOMEN AND PELVIS WITH CONTRAST TECHNIQUE: Multidetector CT imaging of the abdomen and pelvis was performed using the standard protocol following bolus administration of intravenous contrast. RADIATION DOSE REDUCTION: This exam was performed according to the departmental dose-optimization program which includes automated exposure control, adjustment of the mA and/or kV according to patient size and/or use of iterative reconstruction technique. CONTRAST:  75mL OMNIPAQUE  IOHEXOL  350 MG/ML SOLN COMPARISON:  CT abdomen/pelvis dated 04/12/2020. FINDINGS: Lower chest: No acute  abnormality. Hepatobiliary: No focal liver abnormality is seen. No gallstones, gallbladder wall thickening, or biliary dilatation. Pancreas: Unremarkable. No pancreatic ductal dilatation or surrounding inflammatory changes. Spleen: Normal in size without focal abnormality. Adrenals/Urinary Tract: Adrenal glands are unremarkable. Kidneys are normal, without renal calculi, focal lesion, or hydronephrosis. Bladder is unremarkable. Stomach/Bowel: Stomach is within normal limits. Appendix appears normal. No evidence of bowel wall thickening, distention, or inflammatory changes. Vascular/Lymphatic: Abdominal aorta is normal in caliber. No enlarged abdominal or pelvic lymph nodes. Reproductive: No abdominopelvic  ascites. No intraperitoneal free air. No abdominal wall hernia. Other: No abdominal wall hernia or abnormality. No abdominopelvic ascites. Musculoskeletal: No acute or significant osseous findings. IMPRESSION: No acute localizing findings in the abdomen or pelvis. Electronically Signed   By: Harrietta Sherry M.D.   On: 02/15/2024 16:13    Medications Ordered in ED Medications  sucralfate (CARAFATE) 1 GM/10ML suspension 1 g (1 g Oral Given 02/16/24 1644)  famotidine  (PEPCID ) tablet 40 mg (40 mg Oral Given 02/16/24 1644)  haloperidol  (HALDOL ) tablet 2 mg (2 mg Oral Given 02/16/24 1644)  cephALEXin  (KEFLEX ) capsule 500 mg (500 mg Oral Given 02/16/24 1706)    ED Course/ Medical Decision Making/ A&P  Cathy Graham is a 23 y.o. female presents as detailed above  Differential ddx: Gastritis, anxiety, functional abdominal pain, dehydration, electrolyte abnormalities, pancreatitis, cannabinoid hyperemesis  On arrival, patient afebrile hemodynamically stable no hypoxia or respiratory distress.  Patient very anxious with odd affect.  Reviewed reviewed patient's workup from yesterday.  Patient is not found to have any significant lab abnormalities pregnancy test was negative.  CT abdomen pelvis grossly unremarkable.  Patient received liter fluid bolus morphine Zofran  GI cocktail and Bentyl.  Patient removed her IV before she was offered droperidol .  Patient refusing IV today.  Requesting oral medication to help with her abdominal pain. Will administer Carafate and Pepcid  and Haldol . Patient states she had improvement of symptoms after these medications.  Urinalysis resulted positive for urinary tract infection.  Patient received dose of Keflex .  Patient eloped from the ED prior to being able to discuss urinary tract infection results with the patient.      Overall impression gastritis, urinary tract infection   Patient seen with supervising physician who agrees with plan.  Final Clinical  Impression(s) / ED Diagnoses Final diagnoses:  Gastritis without bleeding, unspecified chronicity, unspecified gastritis type  Acute cystitis without hematuria    Rx / DC Orders ED Discharge Orders          Ordered    sucralfate (CARAFATE) 1 GM/10ML suspension  3 times daily with meals & bedtime        02/16/24 1648            Waddell Seats, DO PGY-3 Emergency Medicine    Seats Waddell, DO 02/16/24 2358    Tegeler, Lonni PARAS, MD 02/17/24 1945

## 2024-02-16 NOTE — Discharge Instructions (Addendum)
 Family medicine clinic information is included in this packet.  Please call to schedule an appointment to establish care. You may continue Zofran  every 6 hours for nausea or vomiting.  Recommend also continuing Carafate 10 mL 3 times a day as needed for nausea vomiting.  Recommend slowly advancing diet with dry bland foods such as toast and crackers.  Continue to slowly increase fluids with Gatorade ginger ale Pedialyte etc. Thank you for allowing us  to take care of you today.  We hope you begin feeling better soon.   To-Do:  Please follow-up with your primary doctor within the next 2-3 days. Please return to the Emergency Department or call 911 if you experience chest pain, shortness of breath, severe pain, severe fever, altered mental status, or have any reason to think that you need emergency medical care.  Thank you again.  Hope you feel better soon.  [facility]ol[facility] Pack Department of Emergency Medicine

## 2024-02-16 NOTE — ED Triage Notes (Signed)
 Pt came in via POV d/t abd pain that has been bothering her the past few days. Reports she was seen here for it yesterday & after her CT she removed her IV d/t her anxiety & then she was d/c. Endorses n/v, denies diarrhea. A/Ox4, rates her abd pain 10/10 during triage.

## 2024-02-17 ENCOUNTER — Emergency Department (HOSPITAL_COMMUNITY)
Admission: EM | Admit: 2024-02-17 | Discharge: 2024-02-18 | Disposition: A | Discharge status: Home / Self Care | Payer: MEDICAID | Source: Ambulatory Visit | Attending: Emergency Medicine | Admitting: Emergency Medicine

## 2024-02-17 ENCOUNTER — Other Ambulatory Visit: Payer: Self-pay

## 2024-02-17 ENCOUNTER — Encounter (HOSPITAL_COMMUNITY): Payer: Self-pay

## 2024-02-17 DIAGNOSIS — N3000 Acute cystitis without hematuria: Secondary | ICD-10-CM | POA: Diagnosis not present

## 2024-02-17 DIAGNOSIS — R1010 Upper abdominal pain, unspecified: Secondary | ICD-10-CM

## 2024-02-17 DIAGNOSIS — R109 Unspecified abdominal pain: Secondary | ICD-10-CM | POA: Diagnosis present

## 2024-02-17 LAB — URINALYSIS, ROUTINE W REFLEX MICROSCOPIC
Bilirubin Urine: NEGATIVE
Glucose, UA: NEGATIVE mg/dL
Ketones, ur: 20 mg/dL — AB
Leukocytes,Ua: NEGATIVE
Nitrite: POSITIVE — AB
Protein, ur: 30 mg/dL — AB
Specific Gravity, Urine: 1.025 (ref 1.005–1.030)
pH: 5 (ref 5.0–8.0)

## 2024-02-17 LAB — CBC WITH DIFFERENTIAL/PLATELET
Abs Immature Granulocytes: 0.03 10*3/uL (ref 0.00–0.07)
Basophils Absolute: 0 10*3/uL (ref 0.0–0.1)
Basophils Relative: 0 %
Eosinophils Absolute: 0 10*3/uL (ref 0.0–0.5)
Eosinophils Relative: 0 %
HCT: 35.6 % — ABNORMAL LOW (ref 36.0–46.0)
Hemoglobin: 12.3 g/dL (ref 12.0–15.0)
Immature Granulocytes: 0 %
Lymphocytes Relative: 19 %
Lymphs Abs: 1.3 10*3/uL (ref 0.7–4.0)
MCH: 30.4 pg (ref 26.0–34.0)
MCHC: 34.6 g/dL (ref 30.0–36.0)
MCV: 88.1 fL (ref 80.0–100.0)
Monocytes Absolute: 0.4 10*3/uL (ref 0.1–1.0)
Monocytes Relative: 6 %
Neutro Abs: 5.2 10*3/uL (ref 1.7–7.7)
Neutrophils Relative %: 75 %
Platelets: 309 10*3/uL (ref 150–400)
RBC: 4.04 MIL/uL (ref 3.87–5.11)
RDW: 13.2 % (ref 11.5–15.5)
WBC: 7 10*3/uL (ref 4.0–10.5)
nRBC: 0 % (ref 0.0–0.2)

## 2024-02-17 LAB — COMPREHENSIVE METABOLIC PANEL WITH GFR
ALT: 20 U/L (ref 0–44)
AST: 19 U/L (ref 15–41)
Albumin: 4.8 g/dL (ref 3.5–5.0)
Alkaline Phosphatase: 49 U/L (ref 38–126)
Anion gap: 14 (ref 5–15)
BUN: 8 mg/dL (ref 6–20)
CO2: 21 mmol/L — ABNORMAL LOW (ref 22–32)
Calcium: 9.6 mg/dL (ref 8.9–10.3)
Chloride: 103 mmol/L (ref 98–111)
Creatinine, Ser: 0.32 mg/dL — ABNORMAL LOW (ref 0.44–1.00)
GFR, Estimated: >60 mL/min (ref >60)
Glucose, Bld: 105 mg/dL — ABNORMAL HIGH (ref 70–99)
Potassium: 3 mmol/L — ABNORMAL LOW (ref 3.5–5.1)
Sodium: 138 mmol/L (ref 135–145)
Total Bilirubin: 1.1 mg/dL (ref 0.0–1.2)
Total Protein: 8.4 g/dL — ABNORMAL HIGH (ref 6.5–8.1)

## 2024-02-17 LAB — PREGNANCY, URINE: Preg Test, Ur: NEGATIVE

## 2024-02-17 LAB — HCG, QUANTITATIVE, PREGNANCY: hCG, Beta Chain, Quant, S: <1 m[IU]/mL (ref <5)

## 2024-02-17 MED ORDER — FAMOTIDINE 20 MG PO TABS: 20.0000 mg | ORAL_TABLET | Freq: Two times a day (BID) | ORAL | 0 refills | Status: AC

## 2024-02-17 MED ORDER — POTASSIUM CHLORIDE CRYS ER 20 MEQ PO TBCR
40.0000 meq | EXTENDED_RELEASE_TABLET | Freq: Once | ORAL | Status: AC
  Administered 2024-02-17: 40 meq via ORAL
  Filled 2024-02-17: qty 2

## 2024-02-17 MED ORDER — CEPHALEXIN 500 MG PO CAPS: 500.0000 mg | ORAL_CAPSULE | Freq: Four times a day (QID) | ORAL | 0 refills | Status: AC

## 2024-02-17 MED ORDER — PANTOPRAZOLE SODIUM 40 MG IV SOLR
40.0000 mg | Freq: Once | INTRAVENOUS | Status: AC
  Administered 2024-02-17: 40 mg via INTRAVENOUS
  Filled 2024-02-17: qty 10

## 2024-02-17 MED ORDER — TRAMADOL HCL 50 MG PO TABS: 50.0000 mg | ORAL_TABLET | Freq: Four times a day (QID) | ORAL | 0 refills | Status: AC | PRN

## 2024-02-17 MED ORDER — ONDANSETRON HCL 4 MG/2ML IJ SOLN
4.0000 mg | Freq: Once | INTRAMUSCULAR | Status: AC
  Administered 2024-02-17: 4 mg via INTRAVENOUS
  Filled 2024-02-17: qty 2

## 2024-02-17 MED ORDER — SODIUM CHLORIDE 0.9 % IV BOLUS
1000.0000 mL | Freq: Once | INTRAVENOUS | Status: AC
  Administered 2024-02-17: 1000 mL via INTRAVENOUS

## 2024-02-17 MED ORDER — POTASSIUM CHLORIDE 10 MEQ/100ML IV SOLN
10.0000 meq | Freq: Once | INTRAVENOUS | Status: AC
  Administered 2024-02-17: 10 meq via INTRAVENOUS
  Filled 2024-02-17: qty 100

## 2024-02-17 MED ORDER — KETOROLAC TROMETHAMINE 30 MG/ML IJ SOLN
30.0000 mg | Freq: Once | INTRAMUSCULAR | Status: AC
  Administered 2024-02-17: 30 mg via INTRAVENOUS
  Filled 2024-02-17: qty 1

## 2024-02-17 MED ORDER — LORAZEPAM 2 MG/ML IJ SOLN
0.5000 mg | Freq: Once | INTRAMUSCULAR | Status: AC
  Administered 2024-02-17: 0.5 mg via INTRAVENOUS
  Filled 2024-02-17: qty 1

## 2024-02-17 MED ORDER — ONDANSETRON 4 MG PO TBDP: ORAL_TABLET | ORAL | 0 refills | Status: AC

## 2024-02-17 MED ORDER — SODIUM CHLORIDE 0.9 % IV SOLN
2.0000 g | Freq: Once | INTRAVENOUS | Status: AC
  Administered 2024-02-17: 2 g via INTRAVENOUS
  Filled 2024-02-17: qty 20

## 2024-02-17 NOTE — Discharge Instructions (Signed)
 Call [place] [facility] for follow-up in the next week.  You will be treated for urinary tract infection and inflamed stomach

## 2024-02-17 NOTE — ED Triage Notes (Signed)
 POV/ with family/ c/o generalized ABD pain/ N/V/ unable to keep anything down/ pain since 6/27/ ETOH was involved on 6/27/ pt states she has not drank alcohol since then/ pt admits to smoking marijuana/ pt will not sit still during triage

## 2024-02-17 NOTE — ED Provider Notes (Signed)
 [place] EMERGENCY DEPARTMENT AT [facility] Provider Note   CSN: [id number] Arrival date & time: 02/17/24  1656     Patient presents with: Abdominal Pain   Cathy Graham is a 23 y.o. female.  {Add pertinent medical, surgical, social history, OB history to YEP:67052} Patient complains of nausea and vomiting.  She had a CT scan recently that was unremarkable.  Patient has a history of alcohol abuse   Abdominal Pain      Prior to Admission medications   Medication Sig Start Date End Date Taking? Authorizing Provider  cephALEXin  (KEFLEX ) 500 MG capsule Take 1 capsule (500 mg total) by mouth 4 (four) times daily. 02/17/24  Yes Suzette Pac, MD  famotidine  (PEPCID ) 20 MG tablet Take 1 tablet (20 mg total) by mouth 2 (two) times daily. 02/17/24  Yes Murel Wigle, MD  ondansetron  (ZOFRAN -ODT) 4 MG disintegrating tablet 4mg  ODT q4 hours prn nausea/vomit 02/17/24  Yes Jezabelle Chisolm, MD  traMADol (ULTRAM) 50 MG tablet Take 1 tablet (50 mg total) by mouth every 6 (six) hours as needed. 02/17/24  Yes Carizma Dunsworth, MD  sucralfate (CARAFATE) 1 GM/10ML suspension Take 10 mLs (1 g total) by mouth 4 (four) times daily -  with meals and at bedtime. 02/16/24   Sherrine Birmingham, DO    Allergies: Patient has no known allergies.    Review of Systems  Gastrointestinal:  Positive for abdominal pain.    Updated Vital Signs BP 125/69   Pulse (!) 44   Temp 98.9 F (37.2 C) (Oral)   Resp 16   Wt 61.7 kg   LMP 02/12/2024 (Exact Date)   SpO2 100%   BMI 24.87 kg/m   Physical Exam  (all labs ordered are listed, but only abnormal results are displayed) Labs Reviewed  CBC WITH DIFFERENTIAL/PLATELET - Abnormal; Notable for the following components:      Result Value   HCT 35.6 (*)    All other components within normal limits  COMPREHENSIVE METABOLIC PANEL WITH GFR - Abnormal; Notable for the following components:   Potassium 3.0 (*)    CO2 21 (*)    Glucose, Bld 105 (*)     Creatinine, Ser 0.32 (*)    Total Protein 8.4 (*)    All other components within normal limits  URINALYSIS, ROUTINE W REFLEX MICROSCOPIC - Abnormal; Notable for the following components:   Color, Urine AMBER (*)    APPearance HAZY (*)    Hgb urine dipstick SMALL (*)    Ketones, ur 20 (*)    Protein, ur 30 (*)    Nitrite POSITIVE (*)    Bacteria, UA MANY (*)    All other components within normal limits  URINE CULTURE  HCG, QUANTITATIVE, PREGNANCY  PREGNANCY, URINE    EKG: None  Radiology: No results found.  {Document cardiac monitor, telemetry assessment procedure when appropriate:32947} Procedures   Medications Ordered in the ED  cefTRIAXone (ROCEPHIN) 2 g in sodium chloride  0.9 % 100 mL IVPB (has no administration in time range)  sodium chloride  0.9 % bolus 1,000 mL (0 mLs Intravenous Stopped 02/17/24 2140)  pantoprazole (PROTONIX) injection 40 mg (40 mg Intravenous Given 02/17/24 2043)  ketorolac  (TORADOL ) 30 MG/ML injection 30 mg (30 mg Intravenous Given 02/17/24 2044)  LORazepam  (ATIVAN ) injection 0.5 mg (0.5 mg Intravenous Given 02/17/24 2045)  potassium chloride  10 mEq in 100 mL IVPB (10 mEq Intravenous New Bag/Given 02/17/24 2141)  potassium chloride  SA (KLOR-CON  M) CR tablet 40 mEq (40 mEq  Oral Given 02/17/24 2140)  sodium chloride  0.9 % bolus 1,000 mL (1,000 mLs Intravenous New Bag/Given 02/17/24 2139)  ondansetron  (ZOFRAN ) injection 4 mg (4 mg Intravenous Given 02/17/24 2203)    Clinical Course as of 02/17/24 2245  Mon Feb 17, 2024  1957 Potassium(!): 3.0 [AH]    Clinical Course User Index [AH] Harris, Abigail, PA-C   {Click here for ABCD2, HEART and other calculators REFRESH Note before signing:1}                              Medical Decision Making Amount and/or Complexity of Data Reviewed Labs: ordered. ECG/medicine tests: ordered.  Risk Prescription drug management.   Patient with urinary tract infection and nausea and vomiting most likely from gastritis.   She is hydrated and sent home with Zofran  Pepcid  Keflex  and Ultram.  She will follow-up with PCP  {Document critical care time when appropriate  Document review of labs and clinical decision tools ie CHADS2VASC2, etc  Document your independent review of radiology images and any outside records  Document your discussion with family members, caretakers and with consultants  Document social determinants of health affecting pt's care  Document your decision making why or why not admission, treatments were needed:32947:::1}   Final diagnoses:  Pain of upper abdomen  Acute cystitis without hematuria    ED Discharge Orders          Ordered    cephALEXin  (KEFLEX ) 500 MG capsule  4 times daily        02/17/24 2243    ondansetron  (ZOFRAN -ODT) 4 MG disintegrating tablet        02/17/24 2243    traMADol (ULTRAM) 50 MG tablet  Every 6 hours PRN        02/17/24 2243    famotidine  (PEPCID ) 20 MG tablet  2 times daily        02/17/24 2243

## 2024-02-20 LAB — URINE CULTURE: Culture: >=100000 — AB

## 2024-02-21 ENCOUNTER — Telehealth (HOSPITAL_BASED_OUTPATIENT_CLINIC_OR_DEPARTMENT_OTHER): Payer: Self-pay | Admitting: *Deleted

## 2024-02-21 NOTE — Telephone Encounter (Signed)
 Post ED Visit - Positive Culture Follow-up  Culture report reviewed by antimicrobial stewardship pharmacist: [facility] [x]  Camelia Marina, Pharm.D. []  [name], Pharm.D., BCPS AQ-ID []  [name], Pharm.D., BCPS []  [name], Pharm.D., BCPS []  [place], [address].D., BCPS, AAHIVP []  [name], Pharm.D., BCPS, AAHIVP []  [name], PharmD, BCPS []  [name], PharmD, BCPS []  [name], PharmD, BCPS []  [name], PharmD []  [name], PharmD, BCPS []  [name], PharmD  [facility] []  [name], PharmD []  [name], PharmD []  [name], PharmD []  [name], Rph []  [name], PharmD []  [name], PharmD []  [name], PharmD []  [name], PharmD []  [name], PharmD []  [name], PharmD []  [name], PharmD []  [name], PharmD []  [name], PharmD   Positive urine culture Treated with Cephalexin , organism sensitive to the same and no further patient follow-up is required at this time.  Cathy Graham 02/21/2024, 9:20 AM
